# Patient Record
Sex: Male | Born: 1975 | Race: White | Hispanic: No | Smoking: Current every day smoker
Health system: Southern US, Community
[De-identification: ages and names within clinical notes are randomized; demographics above are authoritative.]

## PROBLEM LIST (undated history)

## (undated) DIAGNOSIS — T7840XA Allergy, unspecified, initial encounter: Secondary | ICD-10-CM

## (undated) DIAGNOSIS — I10 Essential (primary) hypertension: Secondary | ICD-10-CM

## (undated) HISTORY — PX: ANKLE SURGERY: SHX546

## (undated) HISTORY — DX: Allergy, unspecified, initial encounter: T78.40XA

## (undated) HISTORY — PX: HERNIA REPAIR: SHX51

## (undated) HISTORY — PX: HAND SURGERY: SHX662

## (undated) HISTORY — DX: Essential (primary) hypertension: I10

---

## 2005-02-17 ENCOUNTER — Emergency Department (HOSPITAL_COMMUNITY): Admission: EM | Admit: 2005-02-17 | Discharge: 2005-02-17 | Payer: Self-pay | Admitting: Emergency Medicine

## 2009-01-24 ENCOUNTER — Observation Stay (HOSPITAL_COMMUNITY): Admission: AD | Admit: 2009-01-24 | Discharge: 2009-01-24 | Payer: Self-pay | Admitting: Cardiology

## 2011-04-06 LAB — POCT I-STAT, CHEM 8
BUN: 11 mg/dL (ref 6–23)
Calcium, Ion: 1.1 mmol/L — ABNORMAL LOW (ref 1.12–1.32)
Creatinine, Ser: 1 mg/dL (ref 0.4–1.5)
Hemoglobin: 18 g/dL — ABNORMAL HIGH (ref 13.0–17.0)
Sodium: 140 mEq/L (ref 135–145)
TCO2: 25 mmol/L (ref 0–100)

## 2011-04-06 LAB — PROTIME-INR: INR: 1 (ref 0.00–1.49)

## 2011-04-06 LAB — DIFFERENTIAL
Basophils Relative: 1 % (ref 0–1)
Eosinophils Absolute: 0.1 10*3/uL (ref 0.0–0.7)
Eosinophils Relative: 1 % (ref 0–5)
Lymphs Abs: 1.9 10*3/uL (ref 0.7–4.0)
Monocytes Relative: 9 % (ref 3–12)
Neutrophils Relative %: 68 % (ref 43–77)

## 2011-04-06 LAB — POCT CARDIAC MARKERS
CKMB, poc: 1.1 ng/mL (ref 1.0–8.0)
CKMB, poc: <1 ng/mL — ABNORMAL LOW (ref 1.0–8.0)
Myoglobin, poc: 49.1 ng/mL (ref 12–200)
Myoglobin, poc: 70.5 ng/mL (ref 12–200)
Troponin i, poc: <0.05 ng/mL (ref 0.00–0.09)

## 2011-04-06 LAB — CBC
HCT: 50.1 % (ref 39.0–52.0)
MCHC: 34.1 g/dL (ref 30.0–36.0)
MCV: 95.8 fL (ref 78.0–100.0)
Platelets: 208 10*3/uL (ref 150–400)
RBC: 5.23 MIL/uL (ref 4.22–5.81)
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
## 2013-04-14 ENCOUNTER — Encounter (HOSPITAL_COMMUNITY): Payer: Self-pay

## 2013-04-14 ENCOUNTER — Emergency Department (HOSPITAL_COMMUNITY)
Admission: EM | Admit: 2013-04-14 | Discharge: 2013-04-14 | Payer: Self-pay | Attending: Emergency Medicine | Admitting: Emergency Medicine

## 2013-04-14 DIAGNOSIS — T24229A Burn of second degree of unspecified knee, initial encounter: Secondary | ICD-10-CM | POA: Insufficient documentation

## 2013-04-14 DIAGNOSIS — X038XXA Other exposure to controlled fire, not in building or structure, initial encounter: Secondary | ICD-10-CM | POA: Insufficient documentation

## 2013-04-14 DIAGNOSIS — Y9289 Other specified places as the place of occurrence of the external cause: Secondary | ICD-10-CM | POA: Insufficient documentation

## 2013-04-14 DIAGNOSIS — T23209A Burn of second degree of unspecified hand, unspecified site, initial encounter: Secondary | ICD-10-CM | POA: Insufficient documentation

## 2013-04-14 DIAGNOSIS — Y9389 Activity, other specified: Secondary | ICD-10-CM | POA: Insufficient documentation

## 2013-04-14 DIAGNOSIS — F17200 Nicotine dependence, unspecified, uncomplicated: Secondary | ICD-10-CM | POA: Insufficient documentation

## 2013-04-14 LAB — CBC WITH DIFFERENTIAL/PLATELET
Basophils Absolute: 0.1 10*3/uL (ref 0.0–0.1)
Basophils Relative: 0 % (ref 0–1)
Eosinophils Relative: 1 % (ref 0–5)
HCT: 49.4 % (ref 39.0–52.0)
Lymphocytes Relative: 22 % (ref 12–46)
MCHC: 36.4 g/dL — ABNORMAL HIGH (ref 30.0–36.0)
MCV: 88.2 fL (ref 78.0–100.0)
Monocytes Absolute: 1 10*3/uL (ref 0.1–1.0)
Neutro Abs: 8.3 10*3/uL — ABNORMAL HIGH (ref 1.7–7.7)
Platelets: 287 10*3/uL (ref 150–400)
RDW: 12.2 % (ref 11.5–15.5)
WBC: 12.1 10*3/uL — ABNORMAL HIGH (ref 4.0–10.5)

## 2013-04-14 LAB — BASIC METABOLIC PANEL
Calcium: 9.5 mg/dL (ref 8.4–10.5)
Creatinine, Ser: 0.98 mg/dL (ref 0.50–1.35)
GFR calc Af Amer: >90 mL/min (ref >90)
GFR calc non Af Amer: >90 mL/min (ref >90)
Sodium: 137 mEq/L (ref 135–145)

## 2013-04-14 MED ORDER — SODIUM CHLORIDE 0.9 % IV SOLN
INTRAVENOUS | Status: DC
  Administered 2013-04-14: 23:00:00 via INTRAVENOUS

## 2013-04-14 MED ORDER — FENTANYL CITRATE 0.05 MG/ML IJ SOLN: 50.0000 ug | Freq: Once | INTRAMUSCULAR | Status: AC

## 2013-04-14 MED ORDER — HYDROMORPHONE HCL PF 1 MG/ML IJ SOLN
1.0000 mg | INTRAMUSCULAR | Status: DC | PRN
  Administered 2013-04-14 (×2): 1 mg via INTRAVENOUS
  Filled 2013-04-14 (×2): qty 1

## 2013-04-14 MED ORDER — ONDANSETRON HCL 4 MG/2ML IJ SOLN
4.0000 mg | Freq: Once | INTRAMUSCULAR | Status: AC | PRN
  Administered 2013-04-14: 4 mg via INTRAVENOUS
  Filled 2013-04-14: qty 2

## 2013-04-14 MED ORDER — FENTANYL CITRATE 0.05 MG/ML IJ SOLN
INTRAMUSCULAR | Status: AC
  Administered 2013-04-14: 50 ug via INTRAVENOUS
  Filled 2013-04-14: qty 2
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
## 2013-04-30 DIAGNOSIS — IMO0002 Reserved for concepts with insufficient information to code with codable children: Secondary | ICD-10-CM | POA: Insufficient documentation

## 2014-12-02 ENCOUNTER — Other Ambulatory Visit: Payer: Self-pay

## 2014-12-02 ENCOUNTER — Encounter (HOSPITAL_COMMUNITY): Payer: Self-pay | Admitting: Emergency Medicine

## 2014-12-02 ENCOUNTER — Emergency Department (INDEPENDENT_AMBULATORY_CARE_PROVIDER_SITE_OTHER)
Admission: EM | Admit: 2014-12-02 | Discharge: 2014-12-02 | Disposition: A | Discharge status: Home / Self Care | Payer: BC Managed Care – PPO | Source: Home / Self Care | Attending: Emergency Medicine | Admitting: Emergency Medicine

## 2014-12-02 ENCOUNTER — Ambulatory Visit (HOSPITAL_COMMUNITY): Payer: BC Managed Care – PPO | Attending: Emergency Medicine

## 2014-12-02 DIAGNOSIS — Z720 Tobacco use: Secondary | ICD-10-CM

## 2014-12-02 DIAGNOSIS — Z87891 Personal history of nicotine dependence: Secondary | ICD-10-CM | POA: Diagnosis not present

## 2014-12-02 DIAGNOSIS — R079 Chest pain, unspecified: Secondary | ICD-10-CM | POA: Diagnosis not present

## 2014-12-02 DIAGNOSIS — R0789 Other chest pain: Secondary | ICD-10-CM

## 2014-12-02 NOTE — ED Notes (Signed)
Pt states that he has had chest pain for 6 months intermittent chest pain.

## 2014-12-02 NOTE — ED Provider Notes (Signed)
CSN: [id number]     Arrival date & time 12/02/14  1058 History   First MD Initiated Contact with Patient 12/02/14 1145     Chief Complaint  Patient presents with  . 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 It is intermittent. It usually last for only a few seconds at a time. It began over 6 months ago but is getting worse and is now becoming associated with occasional lightheadedness. He states the pain is worse with smoking and sometimes eating; not exertional.  He denies shortness of breath, nausea or vomiting. He has a  family history of early heart disease and diabetes mellitus. Patient states that he is generally sedentary and smokes.   History reviewed. No pertinent past medical history. Past Surgical History  Procedure Laterality Date  . Hand surgery     History reviewed. No pertinent family history. History  Substance Use Topics  . Smoking status: Current Every Day Smoker -- 0.50 packs/day    Types: Cigarettes  . Smokeless tobacco: Not on file  . Alcohol Use: Yes     Comment: regularly    Review of Systems  Constitutional: Negative for fever, activity change and fatigue.  HENT: Negative.   Respiratory: Negative for cough, shortness of breath and wheezing.   Cardiovascular: Positive for chest pain. Negative for palpitations and leg swelling.  Musculoskeletal: Negative.        Movement does not elicit pain however on occasion he will raise his arm and place it over the top of his head and this often relieves the pain.  Skin: Negative.   Neurological: Positive for light-headedness.    Allergies  Penicillins  Home Medications   Prior to Admission medications   Medication Sig Start Date End Date Taking? Authorizing Provider  ibuprofen  (ADVIL,MOTRIN) 200 MG tablet Take 600 mg by mouth every 6 (six) hours as needed for pain.    Historical Provider, MD  oxyCODONE (OXY IR/ROXICODONE) 5 MG immediate release tablet Take 5 mg by mouth once.     Historical Provider, MD   BP 142/92 mmHg  Pulse 109  Temp(Src) 98.1 F (36.7 C) (Oral)  Resp 16  SpO2 100% Physical Exam  Constitutional: He is oriented to person, place, and time. He appears well-developed and well-nourished. No distress.  HENT:  Head: Normocephalic and atraumatic.  Eyes: Conjunctivae and EOM are normal.  Neck: Normal range of motion. Neck supple.  Cardiovascular: Normal rate, regular rhythm, normal heart sounds and intact distal pulses.   Pulmonary/Chest: Effort normal and breath sounds normal. No respiratory distress. He has no wheezes. He has no rales. He exhibits no tenderness.  Unable to locate tenderness within the chest wall, unable to reproduce pain with arm and chest movement.  Abdominal: Soft. He exhibits no distension. There is no tenderness.  Musculoskeletal: Normal range of motion. He exhibits no edema or tenderness.  Lymphadenopathy:    He has no cervical adenopathy.  Neurological: He is alert and oriented to person, place, and time. No cranial nerve deficit.  Skin: Skin is warm and dry. No rash noted.  Psychiatric: He has a normal mood and affect.  Nursing note and vitals reviewed.   ED Course  Procedures (including critical care time) Labs Review Labs Reviewed - No data to display EKG: NSR. No ectopy. No ST-T changes. Normal axis. Imaging  Review Dg Chest 2 View  12/02/2014   CLINICAL DATA:  LEFT upper chest pain and discomfort for 6 months worse in past 2 weeks, history smoking/tobacco abuse  EXAM: CHEST  2 VIEW  COMPARISON:  01/24/2009  FINDINGS: Upper normal heart size.  Mediastinal contours and pulmonary vascularity normal.  Minimal chronic peribronchial thickening.  No pulmonary infiltrate, pleural effusion or pneumothorax.  Bones  unremarkable.  IMPRESSION: No acute abnormalities.   Electronically Signed   By: Ulyses SouthwardMark  Boles M.D.   On: 12/02/2014 13:30     MDM   1. Atypical chest pain   2. Chest pain   3. Tobacco abuse disorder     Chest Pain (Nonspecific) This exam can not conclude that you do not have a heart problem although there is no objective evidence that you are at this time.  To obtain a more through testing for your heart you would need to go to the ED. It is recommended that you obtain a primary care provider as soon as possible for additional evaluation.  If you develop problems as listed below and discussed see medical attention promptly.     Hayden Rasmussenavid [place], NP 12/02/14 1349

## 2014-12-02 NOTE — Discharge Instructions (Signed)
Chest Pain (Nonspecific) This exam can not conclude that you do not have a heart problem although there is no objective evidence that you are at this time.  To obtain a more through testing for your heart you would need to go to the ED. It is recommended that you obtain a primary care provider as soon as possible for additional evaluation.  If you develop problems as listed below and discussed see medical attention promptly.  It is often hard to give a specific diagnosis for the cause of chest pain. There is always a chance that your pain could be related to something serious, such as a heart attack or a blood clot in the lungs. You need to follow up with your health care provider for further evaluation. CAUSES   Heartburn.  Pneumonia or bronchitis.  Anxiety or stress.  Inflammation around your heart (pericarditis) or lung (pleuritis or pleurisy).  A blood clot in the lung.  A collapsed lung (pneumothorax). It can develop suddenly on its own (spontaneous pneumothorax) or from trauma to the chest.  Shingles infection (herpes zoster virus). The chest wall is composed of bones, muscles, and cartilage. Any of these can be the source of the pain.  The bones can be bruised by injury.  The muscles or cartilage can be strained by coughing or overwork.  The cartilage can be affected by inflammation and become sore (costochondritis). DIAGNOSIS  Lab tests or other studies may be needed to find the cause of your pain. Your health care provider may have you take a test called an ambulatory electrocardiogram (ECG). An ECG records your heartbeat patterns over a 24-hour period. You may also have other tests, such as:  Transthoracic echocardiogram (TTE). During echocardiography, sound waves are used to evaluate how blood flows through your heart.  Transesophageal echocardiogram (TEE).  Cardiac monitoring. This allows your health care provider to monitor your heart rate and rhythm in real  time.  Holter monitor. This is a portable device that records your heartbeat and can help diagnose heart arrhythmias. It allows your health care provider to track your heart activity for several days, if needed.  Stress tests by exercise or by giving medicine that makes the heart beat faster. TREATMENT   Treatment depends on what may be causing your chest pain. Treatment may include:  Acid blockers for heartburn.  Anti-inflammatory medicine.  Pain medicine for inflammatory conditions.  Antibiotics if an infection is present.  You may be advised to change lifestyle habits. This includes stopping smoking and avoiding alcohol, caffeine, and chocolate.  You may be advised to keep your head raised (elevated) when sleeping. This reduces the chance of acid going backward from your stomach into your esophagus. Most of the time, nonspecific chest pain will improve within 2-3 days with rest and mild pain medicine.  HOME CARE INSTRUCTIONS   If antibiotics were prescribed, take them as directed. Finish them even if you start to feel better.  For the next few days, avoid physical activities that bring on chest pain. Continue physical activities as directed.  Do not use any tobacco products, including cigarettes, chewing tobacco, or electronic cigarettes.  Avoid drinking alcohol.  Only take medicine as directed by your health care provider.  Follow your health care provider's suggestions for further testing if your chest pain does not go away.  Keep any follow-up appointments you made. If you do not go to an appointment, you could develop lasting (chronic) problems with pain. If there is any problem keeping  an appointment, call to reschedule. SEEK MEDICAL CARE IF:   Your chest pain does not go away, even after treatment.  You have a rash with blisters on your chest.  You have a fever. SEEK IMMEDIATE MEDICAL CARE IF:   You have increased chest pain or pain that spreads to your arm,  neck, jaw, back, or abdomen.  You have shortness of breath.  You have an increasing cough, or you cough up blood.  You have severe back or abdominal pain.  You feel nauseous or vomit.  You have severe weakness.  You faint.  You have chills. This is an emergency. Do not wait to see if the pain will go away. Get medical help at once. Call your local emergency services (911 in [place] Do not drive yourself to the hospital. MAKE SURE YOU:   Understand these instructions.  Will watch your condition.  Will get help right away if you are not doing well or get worse. Document Released: 09/15/2005 Document Revised: 12/11/2013 Document Reviewed: 07/11/2008 [facility] Patient Information 2015 [place], [place]. This information is not intended to replace advice given to you by your health care provider. Make sure you discuss any questions you have with your health care provider.  Myocardial Infarction A myocardial infarction (MI) is also called a heart attack. It causes damage to the heart that cannot be fixed. An MI often happens when a blood clot or other blockage cuts blood flow to the heart. When this happens, certain areas of the heart begin to die. This is an emergency. HOME CARE  Take medicine as told by your doctor.  Change certain behaviors as told by your doctor. This may include:  Quitting smoking.  Being active.  Keeping a healthy weight.  Eating a heart-healthy diet. Ask your doctor for help with this diet.  Keeping your diabetes under control.  Lessening stress.  Limiting how much alcohol you drink. GET HELP RIGHT AWAY IF:  You have crushing or pressure-like chest pain that spreads to the arms, back, neck, or jaw. Call your local emergency services (911 in [place] Do not drive yourself to the hospital.  You have severe chest pain.  You have shortness of breath during rest, sleep, or with activity.  You have sudden sweating or clammy skin.  You feel sick to your  stomach (nauseous) and throw up (vomit).  You suddenly get lightheaded or dizzy.  You feel your heart beating fast or skipping beats. MAKE SURE YOU:   Understand these instructions.  Will watch your condition.  Will get help right away if you are not doing well or get worse. Document Released: 06/06/2012 Document Reviewed: 02/08/2014 [facility] Patient Information 2015 [place], [place]. This information is not intended to replace advice given to you by your health care provider. Make sure you discuss any questions you have with your health care provider.  Smoking Hazards Smoking cigarettes is extremely bad for your health. Tobacco smoke has over 200 known poisons in it. It contains the poisonous gases nitrogen oxide and carbon monoxide. There are over 60 chemicals in tobacco smoke that cause cancer. Some of the chemicals found in cigarette smoke include:   Cyanide.   Benzene.   Formaldehyde.   Methanol (wood alcohol).   Acetylene (fuel used in welding torches).   Ammonia.  Even smoking lightly shortens your life expectancy by several years. You can greatly reduce the risk of medical problems for you and your family by stopping now. Smoking is the most preventable cause of  death and disease in our society. Within days of quitting smoking, your circulation improves, you decrease the risk of having a heart attack, and your lung capacity improves. There may be some increased phlegm in the first few days after quitting, and it may take months for your lungs to clear up completely. Quitting for 10 years reduces your risk of developing lung cancer to almost that of a nonsmoker.  WHAT ARE THE RISKS OF SMOKING? Cigarette smokers have an increased risk of many serious medical problems, including:  Lung cancer.   Lung disease (such as pneumonia, bronchitis, and emphysema).   Heart attack and chest pain due to the heart not getting enough oxygen (angina).   Heart disease and  peripheral blood vessel disease.   Hypertension.   Stroke.   Oral cancer (cancer of the lip, mouth, or voice box).   Bladder cancer.   Pancreatic cancer.   Cervical cancer.   Pregnancy complications, including premature birth.   Stillbirths and smaller newborn babies, birth defects, and genetic damage to sperm.   Early menopause.   Lower estrogen level for women.   Infertility.   Facial wrinkles.   Blindness.   Increased risk of broken bones (fractures).   Senile dementia.   Stomach ulcers and internal bleeding.   Delayed wound healing and increased risk of complications during surgery. Because of secondhand smoke exposure, children of smokers have an increased risk of the following:   Sudden infant death syndrome (SIDS).   Respiratory infections.   Lung cancer.   Heart disease.   Ear infections.  WHY IS SMOKING ADDICTIVE? Nicotine is the chemical agent in tobacco that is capable of causing addiction or dependence. When you smoke and inhale, nicotine is absorbed rapidly into the bloodstream through your lungs. Both inhaled and noninhaled nicotine may be addictive.  WHAT ARE THE BENEFITS OF QUITTING?  There are many health benefits to quitting smoking. Some are:   The likelihood of developing cancer and heart disease decreases. Health improvements are seen almost immediately.   Blood pressure, pulse rate, and breathing patterns start returning to normal soon after quitting.   People who quit may see an improvement in their overall quality of life.  HOW DO YOU QUIT SMOKING? Smoking is an addiction with both physical and psychological effects, and longtime habits can be hard to change. Your health care provider can recommend:  Programs and community resources, which may include group support, education, or therapy.  Replacement products, such as patches, gum, and nasal sprays. Use these products only as directed. Do not replace  cigarette smoking with electronic cigarettes (commonly called e-cigarettes). The safety of e-cigarettes is unknown, and some may contain harmful chemicals. FOR MORE INFORMATION  American Lung Association: www.lung.org  American Cancer Society: www.cancer.org Document Released: 01/13/2005 Document Revised: 09/26/2013 Document Reviewed: 05/28/2013 [facility] Patient Information 2015 [place], [place]. This information is not intended to replace advice given to you by your health care provider. Make sure you discuss any questions you have with your health care provider.

## 2014-12-03 ENCOUNTER — Ambulatory Visit (INDEPENDENT_AMBULATORY_CARE_PROVIDER_SITE_OTHER): Payer: BC Managed Care – PPO | Admitting: Family Medicine

## 2014-12-03 VITALS — BP 136/88 | HR 93 | Temp 97.7°F | Resp 20 | Ht 69.25 in | Wt 234.0 lb

## 2014-12-03 DIAGNOSIS — E669 Obesity, unspecified: Secondary | ICD-10-CM

## 2014-12-03 DIAGNOSIS — Z720 Tobacco use: Secondary | ICD-10-CM

## 2014-12-03 DIAGNOSIS — R0789 Other chest pain: Secondary | ICD-10-CM

## 2014-12-03 DIAGNOSIS — F17200 Nicotine dependence, unspecified, uncomplicated: Secondary | ICD-10-CM

## 2014-12-03 LAB — POCT CBC
GRANULOCYTE PERCENT: 72.5 % (ref 37–80)
HCT, POC: 51.7 % (ref 43.5–53.7)
Hemoglobin: 17.2 g/dL (ref 14.1–18.1)
Lymph, poc: 1.9 (ref 0.6–3.4)
MCH, POC: 31.5 pg — AB (ref 27–31.2)
MCHC: 33.3 g/dL (ref 31.8–35.4)
MCV: 94.8 fL (ref 80–97)
MID (CBC): 0.6 (ref 0–0.9)
MPV: 7.8 fL (ref 0–99.8)
PLATELET COUNT, POC: 232 10*3/uL (ref 142–424)
POC GRANULOCYTE: 6.5 (ref 2–6.9)
POC LYMPH %: 21 % (ref 10–50)
POC MID %: 6.5 % (ref 0–12)
RBC: 5.46 M/uL (ref 4.69–6.13)
RDW, POC: 13.4 %
WBC: 9 10*3/uL (ref 4.6–10.2)

## 2014-12-03 LAB — LIPID PANEL
CHOL/HDL RATIO: 4.6 ratio
Cholesterol: 222 mg/dL — ABNORMAL HIGH (ref 0–200)
HDL: 48 mg/dL (ref >39)
LDL CALC: 125 mg/dL — AB (ref 0–99)
TRIGLYCERIDES: 245 mg/dL — AB (ref <150)
VLDL: 49 mg/dL — AB (ref 0–40)

## 2014-12-03 LAB — COMPREHENSIVE METABOLIC PANEL
ALK PHOS: 74 U/L (ref 39–117)
ALT: 25 U/L (ref 0–53)
AST: 21 U/L (ref 0–37)
Albumin: 4.6 g/dL (ref 3.5–5.2)
BILIRUBIN TOTAL: 0.4 mg/dL (ref 0.2–1.2)
BUN: 16 mg/dL (ref 6–23)
CO2: 29 mEq/L (ref 19–32)
Calcium: 9.3 mg/dL (ref 8.4–10.5)
Chloride: 103 mEq/L (ref 96–112)
Creat: 0.97 mg/dL (ref 0.50–1.35)
Glucose, Bld: 121 mg/dL — ABNORMAL HIGH (ref 70–99)
Potassium: 4.3 mEq/L (ref 3.5–5.3)
SODIUM: 141 meq/L (ref 135–145)
TOTAL PROTEIN: 7.3 g/dL (ref 6.0–8.3)

## 2014-12-03 LAB — POCT GLYCOSYLATED HEMOGLOBIN (HGB A1C): HEMOGLOBIN A1C: 5.2

## 2014-12-03 NOTE — Patient Instructions (Addendum)
Try to increase regular exercise. You do not need to be going to a gym to get exercise, although that is good. I urge you to try to get at least 30 minutes of walking or other moving type exercise at least 5 days a week.  Work hard on weight loss.   Cut out liquid calories. Water is the best drink  Decrease portions at mealtimes, especially of., Pasta, potatoes, rice. Learn to fill up on fruits and vegetables.  Cut out most snacks. If you're going to snack or nipple on something use fruit or grapes or vegetables  Try to be consistent in your eating choices  Cut out obvious junk foods  Stop smoking. This is a choice you need to make. You have cut back, but you are still using crutches. You need to pick a quit date and get rid of everything completely. Tobacco is a very leading cause of cardiovascular disease.  We will let you know the results of the remainder of your labs.  Referral is being made to a cardiologist for you.  Return at anytime if needed.

## 2014-12-03 NOTE — Progress Notes (Signed)
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 [facility] he has been having problems over the last 6 months intermittently with having left chest pain. These are usually brief and fleeting. Some very mild, sometimes sharp. Do not seem to be activity related. Bothers him less at night. He sometimes is a little tender on his left chest wall below the breast. He does smoke though he is working at cutting back on that. There is a significant family history of heart disease in his mother and his father. His father is also diabetic. The patient is very sedentary. [place] Long hours are spent in the truck. There is the stress of the heavy traffic. He does not want to be the cause of anything bad happening. No other major life issues are stresses. He is single.  Objective: No acute distress. Obese. Throat clear. Neck supple without nodes or thyromegaly. No carotid bruits. Chest is clear to auscultation. Heart regular without murmurs gallops or arrhythmias. Abdomen soft without organomegaly mass or tenderness. Mild left lower anterior chest wall tenderness along the rib cage.  EKG was done yesterday and the reading was normal although I could not get the image of the EKG.  Yesterday's chest x-ray was normal.  Assessment: Atypical chest pain Obesity Tobacco use disorder  Plan: He must stop smoking He must work hard on weight loss and regular exercise  Long discussions about both of these.  Baseline labs  Cardiology referral just to be on the safe side because he is a Naval architecttruck driver.  Results for orders placed or performed in visit on 12/03/14  POCT CBC  Result Value Ref Range   WBC 9.0 4.6 - 10.2 K/uL   Lymph, poc 1.9 0.6 - 3.4   POC LYMPH PERCENT 21.0 10 - 50 %L   MID (cbc) 0.6 0 - 0.9   POC MID % 6.5 0 - 12 %M   POC Granulocyte 6.5 2 - 6.9   Granulocyte percent 72.5 37 - 80 %G   RBC 5.46  4.69 - 6.13 M/uL   Hemoglobin 17.2 14.1 - 18.1 g/dL   HCT, POC 40.951.7 81.143.5 - 53.7 %   MCV 94.8 80 - 97 fL   MCH, POC 31.5 (A) 27 - 31.2 pg   MCHC 33.3 31.8 - 35.4 g/dL   RDW, POC 91.413.4 %   Platelet Count, POC 232 142 - 424 K/uL   MPV 7.8 0 - 99.8 fL  POCT glycosylated hemoglobin (Hb A1C)  Result Value Ref Range   Hemoglobin A1C 5.2

## 2014-12-25 ENCOUNTER — Encounter: Payer: Self-pay | Admitting: Cardiovascular Disease

## 2014-12-25 ENCOUNTER — Ambulatory Visit (INDEPENDENT_AMBULATORY_CARE_PROVIDER_SITE_OTHER): Payer: No Typology Code available for payment source | Admitting: Cardiovascular Disease

## 2014-12-25 VITALS — BP 130/74 | HR 80 | Ht 69.0 in | Wt 235.5 lb

## 2014-12-25 DIAGNOSIS — R079 Chest pain, unspecified: Secondary | ICD-10-CM

## 2014-12-25 DIAGNOSIS — R0789 Other chest pain: Secondary | ICD-10-CM | POA: Insufficient documentation

## 2014-12-25 NOTE — Assessment & Plan Note (Signed)
Brandon Mccoy presents with episodes of atypical chest pain. These episodes of pain sound more like gastroesophageal reflux and do not sound like ischemic heart disease. They seem to be affected more by what he eats and how much he eats than by exercise .  He admits that he eats a very poor diet. He no longer gets any exercise and continues to smoke.  I strongly advised him to work on his smoking. He's already started to improve his diet and will make a big effort to further improve his diet. He'll try to gets regular exercise. If his chest pains gradually improve as he fixes his diet and that is reassuring that this is not ischemic heart disease. On the other hand, if he continues to have any episodes of chest discomfort after making significant improvements, we'll need to proceed with further testing. I'll see him back in the office in 2-3 months for follow-up visit for follow-up. He is to call sooner if he has any additional problems.

## 2014-12-25 NOTE — Progress Notes (Signed)
     Brandon ShowsJames Mccoy Date of Birth  05/03/1976       [facility]    [place] [facility] [address]  [address] [place], [place]  [id number]   [place], [place]  [id number] [phone]     [phone]   Fax  [phone]     Fax [phone]  Problem List: 1. Chest pain  History of Present Illness:  Brandon Mccoy is a 39 yo who is [facility] by  [facility]    For evaluation of chest pain .   Chest pains for the past 6 months, last for a few seconds, then recurs within several minutes or several hours.  Intermittant. Various foods may exacerbate these CP.    Works as a Naval architecttruck driver  [place] and his diet is not great  Tries to eat more healthy foods.  Thinks his CP are better if he eats better  Is not able to exercise much ,  Drives 8-10 hours a day.    Smokes - 1/2 ppd, trying to stop.  Occasional beer.   No current outpatient prescriptions on file prior to visit.   No current facility-administered medications on file prior to visit.    Allergies  Allergen Reactions  . Penicillins     unknown    History reviewed. No pertinent past medical history.  Past Surgical History  Procedure Laterality Date  . Hand surgery    . Ankle surgery    . Hernia repair      History  Smoking status  . Current Every Day Smoker -- 0.50 packs/day  . Types: Cigarettes  Smokeless tobacco  . Not on file    History  Alcohol Use  . Yes    Comment: regularly    Family History  Problem Relation Age of Onset  . Heart disease Father   . CVA Father     Reviw of Systems:  Reviewed in the HPI.  All other systems are negative.  Physical Exam: Blood pressure 130/74, pulse 80, height 5\' 9"  (1.753 m), weight 235 lb 8 oz (106.822 kg). Wt Readings from Last 3 Encounters:  12/25/14 235 lb 8 oz (106.822 kg)  12/03/14 234 lb (106.142 kg)     General: Well developed, well nourished, in no acute  distress.  Head: Normocephalic, atraumatic, sclera non-icteric, mucus membranes are moist,   Neck: Supple. Carotids are 2 + without bruits. No JVD   Lungs: Clear   Heart: RR, normal s1s2,   Abdomen: Soft, non-tender, non-distended with normal bowel sounds.  Msk:  Strength and tone are normal   Extremities: No clubbing or cyanosis. No edema.  Distal pedal pulses are 2+ and equal    Neuro: CN II - XII intact.  Alert and oriented X 3.   Psych:  Normal   ECG: Dec 25, 2014:  NSR at 80, normal ECG   Assessment / Plan:

## 2014-12-25 NOTE — Patient Instructions (Signed)
Your physician recommends that you schedule a follow-up appointment in:  3 months with Dr. Elease [facility]imotoNahser [facility]  Your physician recommends that you continue on your current medications as directed. Please refer to the Current Medication list given to you today.

## 2015-03-14 ENCOUNTER — Ambulatory Visit: Payer: No Typology Code available for payment source | Admitting: Cardiovascular Disease

## 2016-04-27 ENCOUNTER — Ambulatory Visit (INDEPENDENT_AMBULATORY_CARE_PROVIDER_SITE_OTHER): Payer: Self-pay | Admitting: Family Medicine

## 2016-04-27 ENCOUNTER — Encounter: Payer: Self-pay | Admitting: Family Medicine

## 2016-04-27 VITALS — BP 165/109 | HR 86 | Temp 98.7°F | Resp 16 | Ht 69.0 in | Wt 249.4 lb

## 2016-04-27 DIAGNOSIS — R519 Headache, unspecified: Secondary | ICD-10-CM | POA: Insufficient documentation

## 2016-04-27 DIAGNOSIS — R0789 Other chest pain: Secondary | ICD-10-CM

## 2016-04-27 DIAGNOSIS — J069 Acute upper respiratory infection, unspecified: Secondary | ICD-10-CM

## 2016-04-27 DIAGNOSIS — R51 Headache: Secondary | ICD-10-CM

## 2016-04-27 DIAGNOSIS — I10 Essential (primary) hypertension: Secondary | ICD-10-CM

## 2016-04-27 DIAGNOSIS — Z7189 Other specified counseling: Secondary | ICD-10-CM

## 2016-04-27 DIAGNOSIS — Z7689 Persons encountering health services in other specified circumstances: Secondary | ICD-10-CM

## 2016-04-27 DIAGNOSIS — F17200 Nicotine dependence, unspecified, uncomplicated: Secondary | ICD-10-CM

## 2016-04-27 DIAGNOSIS — Z720 Tobacco use: Secondary | ICD-10-CM

## 2016-04-27 MED ORDER — FLUTICASONE PROPIONATE 50 MCG/ACT NA SUSP: 2.0000 | Freq: Every day | NASAL | Status: DC

## 2016-04-27 MED ORDER — OXYMETAZOLINE HCL 0.05 % NA SOLN
NASAL | Status: DC
Start: 2016-04-27 — End: 2016-06-10

## 2016-04-27 MED ORDER — AMLODIPINE BESYLATE 5 MG PO TABS
5.0000 mg | ORAL_TABLET | Freq: Every day | ORAL | Status: DC
Start: 2016-04-27 — End: 2016-09-10

## 2016-04-27 MED ORDER — DM-GUAIFENESIN ER 30-600 MG PO TB12
1.0000 | ORAL_TABLET | Freq: Two times a day (BID) | ORAL | Status: DC
Start: 2016-04-27 — End: 2016-06-10

## 2016-04-27 NOTE — Patient Instructions (Addendum)
Your symptoms are consistent with a viral upper respiratory infection. At this time there is no need for antibiotics.  If your symptoms persist for > 10 days or get better and than worsen please let me know. You may have a secondary bacterial infection.  You can use supportive care at home to help with your symptoms. I have sent Mucinex DM to your pharmacy to help break up the congestion and soothe your cough. You can takes this twice daily.  Honey is a natural cough suppressant- so add it to your tea in the morning.  If you have a humidifer, set that up in your bedroom at night.  You take afrin to help congestion. Cordicin HBP.   Add amlodipine for blood pressure. Your goal blood pressure is 140/90. Work on low salt/sodium diet - goal <1.5gm (1,500mg ) per day. Eat a diet high in fruits/vegetables and whole grains.  Look into mediterranean and DASH diet. Goal activity is 110350min/wk of moderate intensity exercise.  This can be split into 30 minute chunks.  If you are not at this level, you can start with smaller 10-15 min increments and slowly build up activity. Look at www.heart.org for more resources  Please seek immediate medical attention at ER or Urgent Care if you develop: Chest pain, pressure or tightness. Shortness of breath accompanied by nausea or diaphoresis Visual changes Numbness or tingling on one side of the body Facial droop Altered mental status Or any concerning symptoms.

## 2016-04-27 NOTE — Progress Notes (Signed)
Subjective:    Patient ID: Brandon Mccoy, male    DOB: 11/16/1976, 40 y.o.   MRN: [id number]  HPI: Brandon Mccoy is a 40 y.o. male presenting on 04/27/2016 for Establish Care   HPI  Pt presents to establish care today. Previous care provider was Dr. Tawanna Cooler in [place].  It has been  years since His last PCP visit. Records from previous provider will be requested and reviewed. Current medical problems include:  Hypertension: Diagnosed at [facility] in [place]. Was given lisinopril for blood pressure. No HA, chest pain, or visual changes. Does not check blood pressure at home.  HA: Starts at base of skull and radiates up head. Hurts when he turns his head. Pain is pulsing. Occurs 4-5 times per month. No sensitivity to light or sound with HA. No nausea or vomiting. Takes ibuprofen 800mg  to ease off. If he lays a certain way- feels like the pillow presses into neck. Can't put weight on neck.  Sinus: Sinus pain x 2 dasy. Frontal pain and congestion. No fever at home. No issues with seasonal allergies. Takes zyrtec at home. No help. Taking nyquil- helped for 5 minutes. DId not take any today.  Pt reporting L sided chest discomfort x 2 years off and on. Occurs at rest while driving. No SOB or diaphoresis.   Health maintenance:  TDAP: 2012 Never had colonscopy.  Smokes 1/2 PPD x20 years. Started age 25. Wants to quit. Drives a truck for living.  Not as active as he used to be.   Past Medical History  Diagnosis Date  . Allergy    Social History   Social History  . Marital Status: Married    Spouse Name: N/A  . Number of Children: N/A  . Years of Education: N/A   Occupational History  . Not on file.   Social History Main Topics  . Smoking status: Current Every Day Smoker -- 0.50 packs/day    Types: Cigarettes  . Smokeless tobacco: Not on file  . Alcohol Use: Yes     Comment: regularly  . Drug Use: No  . Sexual Activity: Not on file   Other Topics Concern  . Not on file   Social  History Narrative   Family History  Problem Relation Age of Onset  . Heart disease Father   . CVA Father   . Diabetes Father   . Diabetes Mother    No current outpatient prescriptions on file prior to visit.   No current facility-administered medications on file prior to visit.    Review of Systems  Constitutional: Positive for fatigue. Negative for fever and chills.  Respiratory: Negative for chest tightness, shortness of breath and wheezing.   Cardiovascular: Negative for chest pain, palpitations and leg swelling.  Gastrointestinal: Negative for nausea, vomiting and abdominal pain.  Endocrine: Negative.   Genitourinary: Negative for dysuria, urgency, discharge, penile pain and testicular pain.  Musculoskeletal: Negative for back pain, joint swelling and arthralgias.  Skin: Negative.   Neurological: Positive for headaches. Negative for dizziness, weakness and numbness.  Psychiatric/Behavioral: Negative for sleep disturbance and dysphoric mood.   Per HPI unless specifically indicated above     Objective:    BP 165/109 mmHg  Pulse 86  Temp(Src) 98.7 F (37.1 C)  Resp 16  Ht 5\' 9"  (1.753 m)  Wt 249 lb 6.4 oz (113.127 kg)  BMI 36.81 kg/m2  Wt Readings from Last 3 Encounters:  04/27/16 249 lb 6.4 oz (113.127 kg)  12/25/14 235 lb  8 oz (106.822 kg)  12/03/14 234 lb (106.142 kg)    Physical Exam  Constitutional: He is oriented to person, place, and time. He appears well-developed and well-nourished. No distress.  HENT:  Head: Normocephalic and atraumatic.  Right Ear: Hearing and tympanic membrane normal.  Left Ear: Hearing normal. A middle ear effusion is present.  Nose: Mucosal edema and rhinorrhea present. Right sinus exhibits no maxillary sinus tenderness and no frontal sinus tenderness. Left sinus exhibits no maxillary sinus tenderness and no frontal sinus tenderness.  Mouth/Throat: Mucous membranes are normal. No uvula swelling. Posterior oropharyngeal erythema  present.  Neck: Neck supple. No thyromegaly present.  Cardiovascular: Normal rate, regular rhythm and normal heart sounds.  Exam reveals no gallop and no friction rub.   No murmur heard. Pulmonary/Chest: Effort normal and breath sounds normal. He has no wheezes.  Abdominal: Soft. Bowel sounds are normal. He exhibits no distension. There is no tenderness. There is no rebound.  Musculoskeletal: Normal range of motion. He exhibits no edema or tenderness.  Neurological: He is alert and oriented to person, place, and time. He has normal reflexes.  Skin: Skin is warm and dry. No rash noted. No erythema.  Psychiatric: He has a normal mood and affect. His behavior is normal. Thought content normal.   Results for orders placed or performed in visit on 12/03/14  Comprehensive metabolic panel  Result Value Ref Range   Sodium 141 135 - 145 mEq/L   Potassium 4.3 3.5 - 5.3 mEq/L   Chloride 103 96 - 112 mEq/L   CO2 29 19 - 32 mEq/L   Glucose, Bld 121 (H) 70 - 99 mg/dL   BUN 16 6 - 23 mg/dL   Creat 0.270.97 2.530.50 - 6.641.35 mg/dL   Total Bilirubin 0.4 0.2 - 1.2 mg/dL   Alkaline Phosphatase 74 39 - 117 U/L   AST 21 0 - 37 U/L   ALT 25 0 - 53 U/L   Total Protein 7.3 6.0 - 8.3 g/dL   Albumin 4.6 3.5 - 5.2 g/dL   Calcium 9.3 8.4 - 40.310.5 mg/dL  Lipid panel  Result Value Ref Range   Cholesterol 222 (H) 0 - 200 mg/dL   Triglycerides 474245 (H) <150 mg/dL   HDL 48 >25>39 mg/dL   Total CHOL/HDL Ratio 4.6 Ratio   VLDL 49 (H) 0 - 40 mg/dL   LDL Cholesterol 956125 (H) 0 - 99 mg/dL  POCT CBC  Result Value Ref Range   WBC 9.0 4.6 - 10.2 K/uL   Lymph, poc 1.9 0.6 - 3.4   POC LYMPH PERCENT 21.0 10 - 50 %L   MID (cbc) 0.6 0 - 0.9   POC MID % 6.5 0 - 12 %M   POC Granulocyte 6.5 2 - 6.9   Granulocyte percent 72.5 37 - 80 %G   RBC 5.46 4.69 - 6.13 M/uL   Hemoglobin 17.2 14.1 - 18.1 g/dL   HCT, POC 38.751.7 56.443.5 - 53.7 %   MCV 94.8 80 - 97 fL   MCH, POC 31.5 (A) 27 - 31.2 pg   MCHC 33.3 31.8 - 35.4 g/dL   RDW, POC 33.213.4 %    Platelet Count, POC 232 142 - 424 K/uL   MPV 7.8 0 - 99.8 fL  POCT glycosylated hemoglobin (Hb A1C)  Result Value Ref Range   Hemoglobin A1C 5.2       Assessment & Plan:   Problem List Items Addressed This Visit      Cardiovascular and Mediastinum  HTN (hypertension)    Add amlodipine to BP regimen. Reviewed DASH diet. Encouraged smoking cessation. Encouarged weight los and exercise. Check CMET and lipid. Recheck 1 mos.       Relevant Medications   lisinopril (PRINIVIL,ZESTRIL) 10 MG tablet   amLODipine (NORVASC) 5 MG tablet   Other Relevant Orders   Comprehensive metabolic panel   Lipid Profile     Other   Chronic daily headache    Likely BP vs tension HA related. Symptoms have improved with controlling BP. Plan to get BP under control and address if continues. Check CMET. Recheck 1 mos.       Relevant Medications   amLODipine (NORVASC) 5 MG tablet   Smoker    Pt encouraged to quit smoking. 5 minutes of smoking cessation counseling given. Referred to [place] [facility].         Other Visit Diagnoses    Encounter to establish care    -  Primary    Upper respiratory infection        Viral URI. Supportive care at home. avoid OTC decongestants due to HTN. Abx indications reviewed. Return if not improving.     Relevant Medications    dextromethorphan-guaiFENesin (MUCINEX DM) 30-600 MG 12hr tablet    oxymetazoline (AFRIN NASAL SPRAY) 0.05 % nasal spray    fluticasone (FLONASE) 50 MCG/ACT nasal spray    Chest discomfort        ECG WNL. 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 If continues to ER or consider cardiology consult    Relevant Orders    EKG 12-Lead       Meds ordered this encounter  Medications  . lisinopril (PRINIVIL,ZESTRIL) 10 MG tablet    Sig: Reported on 04/27/2016    Refill:  0  . amLODipine (NORVASC) 5 MG tablet    Sig: Take 1 tablet (5 mg total) by mouth daily.    Dispense:  30 tablet    Refill:  11    Order Specific Question:  Supervising  Provider    Answer:  Janeann Forehand [phone]  . dextromethorphan-guaiFENesin (MUCINEX DM) 30-600 MG 12hr tablet    Sig: Take 1 tablet by mouth 2 (two) times daily.    Dispense:  20 tablet    Refill:  0    Order Specific Question:  Supervising Provider    Answer:  RAWLEIGH, RODE [phone]  . oxymetazoline (AFRIN NASAL SPRAY) 0.05 % nasal spray    Sig: 2 sprays each nostril twice daily for 3 days.    Dispense:  30 mL    Refill:  0    Order Specific Question:  Supervising Provider    Answer:  LANARD, ARGUIJO [[id number]]  . fluticasone (FLONASE) 50 MCG/ACT nasal spray    Sig: Place 2 sprays into both nostrils daily.    Dispense:  16 g    Refill:  11    Order Specific Question:  Supervising Provider    Answer:  SHYQUAN, STALLBAUMER [phone]      Follow up plan: Return in about 4 weeks (around 05/25/2016) for hypertension.Marland Kitchen

## 2016-04-27 NOTE — Assessment & Plan Note (Signed)
Pt encouraged to quit smoking. 5 minutes of smoking cessation counseling given. Referred to [facility].  

## 2016-04-27 NOTE — Assessment & Plan Note (Signed)
Likely BP vs tension HA related. Symptoms have improved with controlling BP. Plan to get BP under control and address if continues. Check CMET. Recheck 1 mos.

## 2016-04-27 NOTE — Assessment & Plan Note (Signed)
Add amlodipine to BP regimen. Reviewed DASH diet. Encouraged smoking cessation. Encouarged weight los and exercise. Check CMET and lipid. Recheck 1 mos.

## 2016-04-29 LAB — COMPREHENSIVE METABOLIC PANEL
ALK PHOS: 88 IU/L (ref 39–117)
ALT: 57 IU/L — ABNORMAL HIGH (ref 0–44)
AST: 37 IU/L (ref 0–40)
Albumin/Globulin Ratio: 1.6 (ref 1.2–2.2)
Albumin: 4.3 g/dL (ref 3.5–5.5)
BILIRUBIN TOTAL: 0.3 mg/dL (ref 0.0–1.2)
BUN/Creatinine Ratio: 14 (ref 9–20)
BUN: 14 mg/dL (ref 6–24)
CHLORIDE: 99 mmol/L (ref 96–106)
CO2: 24 mmol/L (ref 18–29)
CREATININE: 1.02 mg/dL (ref 0.76–1.27)
Calcium: 9 mg/dL (ref 8.7–10.2)
GFR calc Af Amer: 106 mL/min/{1.73_m2} (ref >59)
GFR calc non Af Amer: 92 mL/min/{1.73_m2} (ref >59)
GLOBULIN, TOTAL: 2.7 g/dL (ref 1.5–4.5)
Glucose: 107 mg/dL — ABNORMAL HIGH (ref 65–99)
POTASSIUM: 4.6 mmol/L (ref 3.5–5.2)
SODIUM: 141 mmol/L (ref 134–144)
Total Protein: 7 g/dL (ref 6.0–8.5)

## 2016-04-29 LAB — LIPID PANEL
CHOLESTEROL TOTAL: 193 mg/dL (ref 100–199)
Chol/HDL Ratio: 4.6 ratio units (ref 0.0–5.0)
HDL: 42 mg/dL (ref >39)
LDL CALC: 94 mg/dL (ref 0–99)
TRIGLYCERIDES: 283 mg/dL — AB (ref 0–149)
VLDL Cholesterol Cal: 57 mg/dL — ABNORMAL HIGH (ref 5–40)

## 2016-05-28 ENCOUNTER — Ambulatory Visit: Payer: Self-pay | Admitting: Family Medicine

## 2016-06-10 ENCOUNTER — Ambulatory Visit (INDEPENDENT_AMBULATORY_CARE_PROVIDER_SITE_OTHER): Payer: PRIVATE HEALTH INSURANCE | Admitting: Family Medicine

## 2016-06-10 ENCOUNTER — Other Ambulatory Visit: Payer: Self-pay | Admitting: Family Medicine

## 2016-06-10 VITALS — BP 149/87 | HR 82 | Temp 98.4°F | Resp 16 | Ht 69.0 in | Wt 248.0 lb

## 2016-06-10 DIAGNOSIS — I10 Essential (primary) hypertension: Secondary | ICD-10-CM

## 2016-06-10 DIAGNOSIS — R740 Nonspecific elevation of levels of transaminase and lactic acid dehydrogenase [LDH]: Secondary | ICD-10-CM | POA: Diagnosis not present

## 2016-06-10 DIAGNOSIS — M25562 Pain in left knee: Secondary | ICD-10-CM

## 2016-06-10 DIAGNOSIS — R7401 Elevation of levels of liver transaminase levels: Secondary | ICD-10-CM

## 2016-06-10 LAB — HEPATIC FUNCTION PANEL
ALBUMIN: 4.4 g/dL (ref 3.6–5.1)
ALK PHOS: 78 U/L (ref 40–115)
ALT: 60 U/L — ABNORMAL HIGH (ref 9–46)
AST: 38 U/L (ref 10–40)
BILIRUBIN DIRECT: 0.1 mg/dL (ref <=0.2)
BILIRUBIN INDIRECT: 0.2 mg/dL (ref 0.2–1.2)
BILIRUBIN TOTAL: 0.3 mg/dL (ref 0.2–1.2)
Total Protein: 7.2 g/dL (ref 6.1–8.1)

## 2016-06-10 MED ORDER — BLOOD PRESSURE CUFF MISC: 1.0000 | Freq: Every day | Status: DC

## 2016-06-10 MED ORDER — MELOXICAM 15 MG PO TABS
15.0000 mg | ORAL_TABLET | Freq: Every day | ORAL | Status: DC
Start: 2016-06-10 — End: 2016-10-19

## 2016-06-10 NOTE — Assessment & Plan Note (Signed)
Monitor closely. 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 Plan to increase amlodipine with continue elevated pressures. BP might be up due to pain today. Recheck 2 mos.

## 2016-06-10 NOTE — Progress Notes (Signed)
Subjective:    Patient ID: Brandon ShowsJames Flaum, male    DOB: Oct 27, 1976, 40 y.o.   MRN: [id number]  HPI: Brandon Mccoy is a 40 y.o. male presenting on 06/10/2016 for Hypertension   HPI  Hypertension- Not checking at home. No chest pain. No shortness of breath. No cough.  Knee pain- Started 3 days ago. Woke up with knee pain. Pain around knee cap. Has had issues with knee in the past. MRI 4 years ago- no issues. Pain occurs with bending and flexing. Knee feels unstable. Has been wearing a brace. Took and ibuprofen for pain- 400am and 400pm.   Past Medical History  Diagnosis Date  . Allergy     Current Outpatient Prescriptions on File Prior to Visit  Medication Sig  . amLODipine (NORVASC) 5 MG tablet Take 1 tablet (5 mg total) by mouth daily.  Marland Kitchen. lisinopril (PRINIVIL,ZESTRIL) 10 MG tablet Reported on 04/27/2016   No current facility-administered medications on file prior to visit.    Review of Systems  Constitutional: Negative for fever and chills.  HENT: Negative.   Respiratory: Negative for chest tightness, shortness of breath and wheezing.   Cardiovascular: Negative for chest pain, palpitations and leg swelling.  Gastrointestinal: Negative for nausea, vomiting and abdominal pain.  Endocrine: Negative.   Genitourinary: Negative for dysuria, urgency, discharge, penile pain and testicular pain.  Musculoskeletal: Negative for back pain, joint swelling and arthralgias.  Skin: Negative.   Neurological: Negative for dizziness, weakness, numbness and headaches.  Psychiatric/Behavioral: Negative for sleep disturbance and dysphoric mood.   Per HPI unless specifically indicated above     Objective:    BP 149/87 mmHg  Pulse 82  Temp(Src) 98.4 F (36.9 C) (Oral)  Resp 16  Ht 5\' 9"  (1.753 m)  Wt 248 lb (112.492 kg)  BMI 36.61 kg/m2  Wt Readings from Last 3 Encounters:  06/10/16 248 lb (112.492 kg)  04/27/16 249 lb 6.4 oz (113.127 kg)  12/25/14 235 lb 8 oz (106.822 kg)    Physical Exam   Constitutional: He is oriented to person, place, and time. He appears well-developed and well-nourished. No distress.  HENT:  Head: Normocephalic and atraumatic.  Neck: Neck supple. No thyromegaly present.  Cardiovascular: Normal rate, regular rhythm and normal heart sounds.  Exam reveals no gallop and no friction rub.   No murmur heard. Pulmonary/Chest: Effort normal and breath sounds normal. He has no wheezes.  Abdominal: Soft. Bowel sounds are normal. He exhibits no distension. There is no tenderness. There is no rebound.  Musculoskeletal: Normal range of motion. He exhibits no edema.       Left knee: He exhibits normal range of motion, no swelling, no effusion, no deformity, no LCL laxity and no MCL laxity. Tenderness found. Patellar tendon tenderness noted.  Neurological: He is alert and oriented to person, place, and time. He has normal reflexes.  Skin: Skin is warm and dry. No rash noted. No erythema.  Psychiatric: He has a normal mood and affect. His behavior is normal. Thought content normal.   Results for orders placed or performed in visit on 04/27/16  Comprehensive metabolic panel  Result Value Ref Range   Glucose 107 (H) 65 - 99 mg/dL   BUN 14 6 - 24 mg/dL   Creatinine, Ser 4.091.02 0.76 - 1.27 mg/dL   GFR calc non Af Amer 92 >59 mL/min/1.73   GFR calc Af Amer 106 >59 mL/min/1.73   BUN/Creatinine Ratio 14 9 - 20   Sodium 141 134 - 144  mmol/L   Potassium 4.6 3.5 - 5.2 mmol/L   Chloride 99 96 - 106 mmol/L   CO2 24 18 - 29 mmol/L   Calcium 9.0 8.7 - 10.2 mg/dL   Total Protein 7.0 6.0 - 8.5 g/dL   Albumin 4.3 3.5 - 5.5 g/dL   Globulin, Total 2.7 1.5 - 4.5 g/dL   Albumin/Globulin Ratio 1.6 1.2 - 2.2   Bilirubin Total 0.3 0.0 - 1.2 mg/dL   Alkaline Phosphatase 88 39 - 117 IU/L   AST 37 0 - 40 IU/L   ALT 57 (H) 0 - 44 IU/L  Lipid Profile  Result Value Ref Range   Cholesterol, Total 193 100 - 199 mg/dL   Triglycerides 191283 (H) 0 - 149 mg/dL   HDL 42 >47>39 mg/dL   VLDL  Cholesterol Cal 57 (H) 5 - 40 mg/dL   LDL Calculated 94 0 - 99 mg/dL   Chol/HDL Ratio 4.6 0.0 - 5.0 ratio units      Assessment & Plan:   Problem List Items Addressed This Visit      Cardiovascular and Mediastinum   HTN (hypertension) - Primary    Monitor closely. 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 Plan to increase amlodipine with continue elevated pressures. BP might be up due to pain today. Recheck 2 mos.       Relevant Medications   Blood Pressure Monitoring (BLOOD PRESSURE CUFF) MISC    Other Visit Diagnoses    Elevated transaminase level        Recheck hepatic function panel today.     Relevant Orders    Hepatic function panel    Anterior knee pain, left        Meloxicam PRN for pain. Ice and brace as needed. Return if not improving. Consider ortho referral.     Relevant Medications    meloxicam (MOBIC) 15 MG tablet       Meds ordered this encounter  Medications  . meloxicam (MOBIC) 15 MG tablet    Sig: Take 1 tablet (15 mg total) by mouth daily.    Dispense:  30 tablet    Refill:  0    Order Specific Question:  Supervising Provider    Answer:  Janeann ForehandHAWKINS JR, Markos H [phone][[id number]]  . Blood Pressure Monitoring (BLOOD PRESSURE CUFF) MISC    Sig: 1 each by Does not apply route daily.    Dispense:  1 each    Refill:  0    Order Specific Question:  Supervising Provider    Answer:  Janeann ForehandHAWKINS JR, Autry H [[id number]]      Follow up plan: Return in about 2 months (around 08/10/2016) for BP check. .Marland Kitchen

## 2016-06-10 NOTE — Patient Instructions (Signed)
Knee pain: Ice as needed for pain. Take meloxicam as needed for pain. No other advil or ibuprofen while taking meloxicam. Please let me know if knee pain continues- let me know. We will have you see an orthopedist.   Hypertension: Check blood pressure over the next few weeks. Your goal is <140/90. Please call me if your BP is consistently > 140/90.  We will plan to double your amlodipine at that time.

## 2016-06-11 LAB — HEPATITIS PANEL, ACUTE
HCV AB: NEGATIVE
HEP A IGM: NONREACTIVE
HEP B C IGM: NONREACTIVE
HEP B S AG: NEGATIVE

## 2016-08-20 ENCOUNTER — Ambulatory Visit: Payer: PRIVATE HEALTH INSURANCE | Admitting: Family Medicine

## 2016-08-31 ENCOUNTER — Ambulatory Visit: Payer: PRIVATE HEALTH INSURANCE | Admitting: Family Medicine

## 2016-09-10 ENCOUNTER — Encounter (INDEPENDENT_AMBULATORY_CARE_PROVIDER_SITE_OTHER): Payer: Self-pay

## 2016-09-10 ENCOUNTER — Encounter: Payer: Self-pay | Admitting: Family Medicine

## 2016-09-10 ENCOUNTER — Ambulatory Visit (INDEPENDENT_AMBULATORY_CARE_PROVIDER_SITE_OTHER): Payer: PRIVATE HEALTH INSURANCE | Admitting: Family Medicine

## 2016-09-10 VITALS — BP 152/80 | HR 100 | Temp 98.3°F | Resp 16 | Ht 69.0 in | Wt 249.0 lb

## 2016-09-10 DIAGNOSIS — R748 Abnormal levels of other serum enzymes: Secondary | ICD-10-CM

## 2016-09-10 DIAGNOSIS — E781 Pure hyperglyceridemia: Secondary | ICD-10-CM | POA: Diagnosis not present

## 2016-09-10 DIAGNOSIS — I10 Essential (primary) hypertension: Secondary | ICD-10-CM

## 2016-09-10 DIAGNOSIS — R079 Chest pain, unspecified: Secondary | ICD-10-CM | POA: Diagnosis not present

## 2016-09-10 DIAGNOSIS — R7301 Impaired fasting glucose: Secondary | ICD-10-CM | POA: Diagnosis not present

## 2016-09-10 DIAGNOSIS — Z720 Tobacco use: Secondary | ICD-10-CM

## 2016-09-10 DIAGNOSIS — F17200 Nicotine dependence, unspecified, uncomplicated: Secondary | ICD-10-CM

## 2016-09-10 LAB — LIPID PANEL
CHOL/HDL RATIO: 4.9 ratio (ref <=5.0)
Cholesterol: 211 mg/dL — ABNORMAL HIGH (ref 125–200)
HDL: 43 mg/dL (ref >=40)
LDL Cholesterol: 116 mg/dL (ref <130)
Triglycerides: 258 mg/dL — ABNORMAL HIGH (ref <150)
VLDL: 52 mg/dL — ABNORMAL HIGH (ref <30)

## 2016-09-10 LAB — COMPLETE METABOLIC PANEL WITH GFR
ALBUMIN: 4.4 g/dL (ref 3.6–5.1)
ALK PHOS: 68 U/L (ref 40–115)
ALT: 67 U/L — AB (ref 9–46)
AST: 44 U/L — AB (ref 10–40)
BUN: 14 mg/dL (ref 7–25)
CO2: 21 mmol/L (ref 20–31)
CREATININE: 1.03 mg/dL (ref 0.60–1.35)
Calcium: 9.9 mg/dL (ref 8.6–10.3)
Chloride: 106 mmol/L (ref 98–110)
GFR, Est African American: >89 mL/min (ref >=60)
GFR, Est Non African American: >89 mL/min (ref >=60)
GLUCOSE: 130 mg/dL — AB (ref 65–99)
POTASSIUM: 4.5 mmol/L (ref 3.5–5.3)
SODIUM: 141 mmol/L (ref 135–146)
TOTAL PROTEIN: 7.4 g/dL (ref 6.1–8.1)
Total Bilirubin: 0.2 mg/dL (ref 0.2–1.2)

## 2016-09-10 LAB — POCT GLYCOSYLATED HEMOGLOBIN (HGB A1C): Hemoglobin A1C: 5.4

## 2016-09-10 MED ORDER — AMLODIPINE BESYLATE 10 MG PO TABS: 10.0000 mg | ORAL_TABLET | Freq: Every day | ORAL | 3 refills | Status: DC

## 2016-09-10 NOTE — Patient Instructions (Signed)
Blood pressure:   I am increasing your amlodipine to 10mg  daily. Take 2 of the 5mg  pills until the bottle is empty. Continue to check your BP at home.  Your goal blood pressure is 140/90 Work on low salt/sodium diet - goal <1.5gm (1,500mg ) per day. Eat a diet high in fruits/vegetables and whole grains.  Look into mediterranean and DASH diet. Goal activity is 11550min/wk of moderate intensity exercise.  This can be split into 30 minute chunks.  If you are not at this level, you can start with smaller 10-15 min increments and slowly build up activity. Look at www.heart.org for more resources  Chest pain: Your ECG today is normal. It was likely muscular pain- however if chest pain occurs again, please go to the ER for evaluation.   Smoking cessation will reduce your risk for heart disease and may help control your blood pressure.

## 2016-09-10 NOTE — Assessment & Plan Note (Signed)
Encouraged smoking cessation. Pt has tried E-cigarettes in the past with good effect.

## 2016-09-10 NOTE — Progress Notes (Signed)
Subjective:    Patient ID: Brandon ShowsJames Fulmore, male    DOB: 1976/06/13, 40 y.o.   MRN: [id number]  HPI: Brandon Mccoy is a 40 y.o. male presenting on 09/10/2016 for Hypertension   HPI  Pt presents for hypertension follow-up. BP is elevated today. He did not take medication this morning. He ran out. He also did not smoke. 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 Checking at home. Avg- 140-150/90-95. No chest pain or visual changes. No shortness of breath.  2 months ago was having sharp L axilla pain. Sharp occurring every 5 minutes or so. He did not seek medical care at the time. It did not occur again. No SOB. No diaphoresis.  Trying to eat healthy. 1/2 pack per day smoker. Family history of heart disease- Dad- MI and  Mom has valve issues.  DId have a heart cath in 2006 when he had chest pain due to his family history. Was clear.   Taking meloxicam PRN for knee pain. Takes   Family History  Problem Relation Age of Onset  . Heart disease Father   . CVA Father   . Diabetes Father   . Diabetes Mother      Past Medical History:  Diagnosis Date  . Allergy     Current Outpatient Prescriptions on File Prior to Visit  Medication Sig  . Blood Pressure Monitoring (BLOOD PRESSURE CUFF) MISC 1 each by Does not apply route daily.  Marland Kitchen. lisinopril (PRINIVIL,ZESTRIL) 10 MG tablet Reported on 04/27/2016  . meloxicam (MOBIC) 15 MG tablet Take 1 tablet (15 mg total) by mouth daily.   No current facility-administered medications on file prior to visit.     Review of Systems  Constitutional: Negative for chills and fever.  HENT: Negative.   Respiratory: Negative for chest tightness, shortness of breath and wheezing.   Cardiovascular: Positive for chest pain (2 mos ago. ). Negative for palpitations and leg swelling.  Gastrointestinal: Negative for abdominal pain, nausea and vomiting.  Endocrine: Negative.   Genitourinary: Negative for discharge,  dysuria, penile pain, testicular pain and urgency.  Musculoskeletal: Negative for arthralgias, back pain and joint swelling.  Skin: Negative.   Neurological: Negative for dizziness, weakness, numbness and headaches.  Psychiatric/Behavioral: Negative for dysphoric mood and sleep disturbance.   Per HPI unless specifically indicated above     Objective:    BP (!) 152/80   Pulse 100   Temp 98.3 F (36.8 C) (Oral)   Resp 16   Ht 5\' 9"  (1.753 m)   Wt 249 lb (112.9 kg)   BMI 36.77 kg/m   Wt Readings from Last 3 Encounters:  09/10/16 249 lb (112.9 kg)  06/10/16 248 lb (112.5 kg)  04/27/16 249 lb 6.4 oz (113.1 kg)    Physical Exam  Constitutional: He is oriented to person, place, and time. He appears well-developed and well-nourished. No distress.  HENT:  Head: Normocephalic and atraumatic.  Neck: Neck supple. No thyromegaly present.  Cardiovascular: Normal rate, regular rhythm and normal heart sounds.  Exam reveals no gallop and no friction rub.   No murmur heard. Pulmonary/Chest: Effort normal and breath sounds normal. He has no wheezes.  Abdominal: Soft. Bowel sounds are normal. He exhibits no distension. There is no tenderness. There is no rebound.  Musculoskeletal: Normal range of motion. He exhibits no edema or tenderness.  Neurological: He is alert and oriented to person, place, and time. He has normal reflexes.  Skin:  Skin is warm and dry. No rash noted. No erythema.  Psychiatric: He has a normal mood and affect. His behavior is normal. Thought content normal.   Results for orders placed or performed in visit on 09/10/16  POCT HgB A1C  Result Value Ref Range   Hemoglobin A1C 5.4       Assessment & Plan:   Problem List Items Addressed This Visit      Cardiovascular and Mediastinum   HTN (hypertension) - Primary    Increase amlodipine to 10mg  daily due to elevated BP at home. Recheck CMP today. Encouraged DASH diet and smoking cessation. Recheck 3 weeks.        Relevant Medications   amLODipine (NORVASC) 10 MG tablet   Other Relevant Orders   COMPLETE METABOLIC PANEL WITH GFR     Other   Smoker    Encouraged smoking cessation. Pt has tried E-cigarettes in the past with good effect.       Relevant Orders   Nurse to provide smoking / tobacco cessation education   Elevated liver enzymes    Recheck CMP today. Likely meloxicam related. If still elevated- will plan to obtain liver US.        Other Visit Diagnoses    Elevated fasting blood sugar       A1c in normal range.    Relevant Orders   POCT HgB A1C (Completed)   Left sided chest pain       Likely MSK. Difficult to work-up today 2/2 symptoms occurring so long ago. Discussed cardiology referral- pt would like to wait at this time. Labs to stratify risk factors. Encouraged smoking cessation. Pt aware to go to the ER if chest pain occurs again. Recheck 3 weeks.    Relevant Orders   EKG 12-Lead   Hypertriglyceridemia       Recheck lipids today.    Relevant Medications   amLODipine (NORVASC) 10 MG tablet   Other Relevant Orders   Lipid Profile      Meds ordered this encounter  Medications  . amLODipine (NORVASC) 10 MG tablet    Sig: Take 1 tablet (10 mg total) by mouth daily.    Dispense:  90 tablet    Refill:  3    Order Specific Question:   Supervising Provider    Answer:   RIDER, ERMIS [[id number]]      Follow up plan: Return in about 3 weeks (around 10/01/2016) for BP check. Marland Kitchen

## 2016-09-10 NOTE — Assessment & Plan Note (Signed)
Increase amlodipine to 10mg  daily due to elevated BP at home. Recheck CMP today. Encouraged DASH diet and smoking cessation. Recheck 3 weeks.

## 2016-09-10 NOTE — Assessment & Plan Note (Signed)
Recheck CMP today. Likely meloxicam related. If still elevated- will plan to obtain liver US.

## 2016-09-13 ENCOUNTER — Other Ambulatory Visit: Payer: Self-pay | Admitting: Family Medicine

## 2016-09-13 DIAGNOSIS — R748 Abnormal levels of other serum enzymes: Secondary | ICD-10-CM

## 2016-09-14 ENCOUNTER — Ambulatory Visit: Payer: PRIVATE HEALTH INSURANCE | Admitting: Family Medicine

## 2016-09-17 ENCOUNTER — Ambulatory Visit: Payer: No Typology Code available for payment source

## 2016-10-01 ENCOUNTER — Ambulatory Visit: Payer: Commercial Managed Care - PPO

## 2016-10-01 ENCOUNTER — Ambulatory Visit: Payer: PRIVATE HEALTH INSURANCE | Admitting: Family Medicine

## 2016-10-05 ENCOUNTER — Ambulatory Visit: Payer: No Typology Code available for payment source

## 2016-10-05 ENCOUNTER — Ambulatory Visit: Payer: PRIVATE HEALTH INSURANCE | Admitting: Family Medicine

## 2016-10-19 ENCOUNTER — Encounter: Payer: Self-pay | Admitting: Family Medicine

## 2016-10-19 ENCOUNTER — Ambulatory Visit: Admission: RE | Admit: 2016-10-19 | Payer: No Typology Code available for payment source | Source: Ambulatory Visit

## 2016-10-19 ENCOUNTER — Ambulatory Visit (INDEPENDENT_AMBULATORY_CARE_PROVIDER_SITE_OTHER): Payer: PRIVATE HEALTH INSURANCE | Admitting: Family Medicine

## 2016-10-19 VITALS — BP 128/70 | HR 74 | Temp 98.3°F | Resp 16 | Ht 69.0 in | Wt 249.0 lb

## 2016-10-19 DIAGNOSIS — E782 Mixed hyperlipidemia: Secondary | ICD-10-CM

## 2016-10-19 DIAGNOSIS — E669 Obesity, unspecified: Secondary | ICD-10-CM

## 2016-10-19 DIAGNOSIS — I10 Essential (primary) hypertension: Secondary | ICD-10-CM

## 2016-10-19 DIAGNOSIS — R748 Abnormal levels of other serum enzymes: Secondary | ICD-10-CM | POA: Diagnosis not present

## 2016-10-19 DIAGNOSIS — Z720 Tobacco use: Secondary | ICD-10-CM | POA: Diagnosis not present

## 2016-10-19 DIAGNOSIS — E785 Hyperlipidemia, unspecified: Secondary | ICD-10-CM | POA: Insufficient documentation

## 2016-10-19 NOTE — Assessment & Plan Note (Signed)
Active smoker, 0.5ppd >2 yrs Not ready to quit Smoking cessation counseling today

## 2016-10-19 NOTE — Progress Notes (Signed)
Subjective:    Patient ID: Brandon Mccoy, male    DOB: 12/14/1976, 40 y.o.   MRN: [id number]  Brandon Mccoy is a 40 y.o. male presenting on 10/19/2016 for Hypertension  HPI  CHRONIC HTN: Reports last visit 09/10/16 for BP follow-up had elevated BP, admits to prior history of some missed anti-HTN meds. Current Meds - Amlodipine 10mg  daily (last dose at 0100, usually takes within 24-26 hours, previously on 5mg , recently increased at last visit), no other HTN meds previously. He has a BP cuff at home does not check regularly. Reports good compliance, took meds today. Tolerating well, w/o complaints. 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, he does have a fridge in truck and can bring some fresh vegetables, likes celery, drinks mostly water, no salt added) - Family history of HTN, DM Denies CP, dyspnea, HA, edema, dizziness / lightheadedness  HLD / OBESITY BMI >36 - Last fasting lipid panel 08/2016, with elevated TG and total cholesterol - Wt stable, down 1 lb in 1 month, overall has gained +10-15 lbs in past 2 years  Elevated LFTs / H/l Alcohol Use  - Reports he had several alcoholic mixed drinks night before he came in for blood work last time and is concerned that this may have worsened his LFTs, now drinks about 1-5 mixed drinks weekly usually all on one day when home after driving for work. Previously heavier drinker, but has reduced. No prior history of alcohol detox or withdrawal symptoms - He was scheduled for abdominal US for eval of liver today, but he missed this appointment by accident, and will contact them to re-schedule  Low Back Injury - Currently being followed by other physicians for worker's comp low back lifting injury at work. He reviews this course today but we are not actively managing this problem. - Describes recent injury on 10/03/16 at work involving lifting, initially went to [facility],  treated with ibuprofen 800mg  and baclofen, going to employee outpatient PT, has followed up with worker's comp doctors for this problem. Has not been released back to work yet  TOBACCO ABUSE: - Chronic problem for past about 20 years, around 0.5ppd daily, has tapered down with trial on e-cigs, has not successfully quit  Social History  Substance Use Topics  . Smoking status: Current Every Day Smoker    Packs/day: 0.50    Types: Cigarettes  . Smokeless tobacco: Not on file  . Alcohol use Yes     Comment: regularly    Review of Systems Per HPI unless specifically indicated above     Objective:    BP 128/70 (BP Location: Left Arm, Cuff Size: Normal)   Pulse 74   Temp 98.3 F (36.8 C) (Oral)   Resp 16   Ht 5\' 9"  (1.753 m)   Wt 249 lb (112.9 kg)   BMI 36.77 kg/m   Wt Readings from Last 3 Encounters:  10/19/16 249 lb (112.9 kg)  09/10/16 249 lb (112.9 kg)  06/10/16 248 lb (112.5 kg)    Physical Exam  Constitutional: He appears well-developed and well-nourished. No distress.  Well-appearing, comfortable, cooperative, overweight  HENT:  Head: Normocephalic and atraumatic.  Mouth/Throat: Oropharynx is clear and moist.  Eyes: Conjunctivae are normal. Pupils are equal, round, and reactive to light.  Neck: Normal range of motion. Neck supple. No thyromegaly present.  Cardiovascular: Normal rate, regular rhythm, normal heart sounds and intact distal pulses.  No murmur heard. Pulmonary/Chest: Effort normal and breath sounds normal. No respiratory distress. He has no wheezes. He has no rales.  Musculoskeletal: He exhibits no edema.  Lymphadenopathy:    He has no cervical adenopathy.  Neurological: He is alert.  Skin: Skin is warm and dry. He is not diaphoretic.  Nursing note and vitals reviewed.  I have personally reviewed the following lab results from 09/10/16.  Results for orders placed or performed in visit on 09/10/16  COMPLETE METABOLIC PANEL WITH GFR  Result Value Ref  Range   Sodium 141 135 - 146 mmol/L   Potassium 4.5 3.5 - 5.3 mmol/L   Chloride 106 98 - 110 mmol/L   CO2 21 20 - 31 mmol/L   Glucose, Bld 130 (H) 65 - 99 mg/dL   BUN 14 7 - 25 mg/dL   Creat 1.61 0.96 - 0.45 mg/dL   Total Bilirubin 0.2 0.2 - 1.2 mg/dL   Alkaline Phosphatase 68 40 - 115 U/L   AST 44 (H) 10 - 40 U/L   ALT 67 (H) 9 - 46 U/L   Total Protein 7.4 6.1 - 8.1 g/dL   Albumin 4.4 3.6 - 5.1 g/dL   Calcium 9.9 8.6 - 40.9 mg/dL   GFR, Est African American >89 >=60 mL/min   GFR, Est Non African American >89 >=60 mL/min  Lipid Profile  Result Value Ref Range   Cholesterol 211 (H) 125 - 200 mg/dL   Triglycerides 811 (H) <150 mg/dL   HDL 43 >=91 mg/dL   Total CHOL/HDL Ratio 4.9 <=5.0 Ratio   VLDL 52 (H) <30 mg/dL   LDL Cholesterol 478 <295 mg/dL  POCT HgB A2Z  Result Value Ref Range   Hemoglobin A1C 5.4       Assessment & Plan:   Problem List Items Addressed This Visit    Tobacco abuse    Active smoker, 0.5ppd >2 yrs Not ready to quit Smoking cessation counseling today      Obesity (BMI 35.0-39.9 without comorbidity)   HTN (hypertension) - Primary    Initial mild elevated BP, improved on manual re-check at end of visit. Also some mild low back pain from recent injury Seems improved control on Amlodipine 10mg  daily, increased 1 month ago  Plan: 1. Continue current Amlodipine 10mg  today 2. Check BP regularly outside office, record log, bring to next visit 3. 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 Smoking cessation counseling, advised recommend reduce alcohol consumption 5. Follow-up 1 month BP re-check, may need low dose additional thiazide vs ACE if not optimally controlled      HLD (hyperlipidemia)     Elevated TG on last lipid panel 08/2016. - Calculated ASCVD 10 yr risk up to 6.4% as smoker, and treated for BP. However if non-smoker down to 2% - No indication for statin therapy at this time, regardless of liver, awaiting on liver US, suspected fatty liver - Lifestyle modifications      Elevated liver enzymes    Persisetntly elevated LFTs on last 3 chemistry panels, since 04/2016, last result 08/2016 patient admittedly had several mixed drinks night before, perhaps this may have increased but regardless, consistent elevated LFT trend - Suspect fatty liver vs less likely alcoholic hepatic injury  Plan: 1. Advised patient to re-schedule his abdominal US to eval liver, he missed this apt today 2. Reduce alcohol consumption, improve healthy diet and exercise 3. Follow-up       Other Visit Diagnoses   None.         Follow up plan: Return in about 4 weeks (around 11/16/2016) for blood pressure.  Saralyn PilarAlexander Karamalegos, DO [facility] 10/19/2016, 5:44 PM

## 2016-10-19 NOTE — Assessment & Plan Note (Signed)
Elevated TG on last lipid panel 08/2016. - Calculated ASCVD 10 yr risk up to 6.4% as smoker, and treated for BP. However if non-smoker down to 2% - No indication for statin therapy at this time, regardless of liver, awaiting on liver US, suspected fatty liver - Lifestyle modifications

## 2016-10-19 NOTE — Assessment & Plan Note (Signed)
Initial mild elevated BP, improved on manual re-check at end of visit. Also some mild low back pain from recent injury Seems improved control on Amlodipine 10mg  daily, increased 1 month ago  Plan: 1. Continue current Amlodipine 10mg  today 2. Check BP regularly outside office, record log, bring to next visit 3. 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 Smoking cessation counseling, advised recommend reduce alcohol consumption 5. Follow-up 1 month BP re-check, may need low dose additional thiazide vs ACE if not optimally controlled

## 2016-10-19 NOTE — Assessment & Plan Note (Signed)
Persisetntly elevated LFTs on last 3 chemistry panels, since 04/2016, last result 08/2016 patient admittedly had several mixed drinks night before, perhaps this may have increased but regardless, consistent elevated LFT trend - Suspect fatty liver vs less likely alcoholic hepatic injury  Plan: 1. Advised patient to re-schedule his abdominal US to eval liver, he missed this apt today 2. Reduce alcohol consumption, improve healthy diet and exercise 3. Follow-up

## 2016-10-19 NOTE — Patient Instructions (Signed)
Thank you for coming in to clinic today.  1. BP is improved on re-check today. I think that the Amodipine 10mg  daily is working well. We do need more data as discussed. Keep track of BP various times of day at home, more restful environment. At first can check daily or twice a day then space it out every other day if BP numbers are good. - Bring sheet to next visit  Factors that affect BP - Smoking, try to cut back and quit - Alcohol, even small amount but regular consumption can raise BP, and this will help your liver regardless - Low sodium diet, this is separate from the Diabetic Diet recommendations with low carb, but look at food labels, and inc fresh vegetables - Regular exercise, as discussed this may be most difficult, even if you could do 1 day a week of cardiovascular aerobic exercise such as walking 30 min this would certainly help  Re schedule your abdominal ultrasound  [facility] Address: [address], [place], [place] [address] Phone: [phone]  Eat at least 3 meals and 1-2 snacks per day (don't skip breakfast).  Aim for no more than 5 hours between eating. - Tip: If you go >5 hours without eating and become very hungry, your body will supply it's own resources temporarily and you can gain extra weight when you eat.  The 5 Minute Rule of Exercise - Promise yourself to at least do 5 minutes of exercise (make sure you time it), and if at the end of 5 minutes (this is the hardest part of the work-out), if you still feel like you want stop (or not motivated to continue) then allow yourself to stop. Otherwise, more often than not you will feel encouraged that you can continue for a little while longer or even more!  Diet Recommendations for Diabetes - Low Carb Diet (good to follow for weight loss in general)   Starchy (carb) foods include: Bread, rice, pasta, potatoes, corn, crackers, bagels, muffins, all baked goods.   Protein foods  include: Meat, fish, poultry, eggs, dairy foods, and beans such as pinto and kidney beans (beans also provide carbohydrate).   1. Eat at least 3 meals and 1-2 snacks per day. Never go more than 4-5 hours while awake without eating.   2. Limit starchy foods to TWO per meal and ONE per snack. ONE portion of a starchy  food is equal to the following:   - ONE slice of bread (or its equivalent, such as half of a hamburger bun).   - 1/2 cup of a "scoopable" starchy food such as potatoes or rice.   - 1 OUNCE (28 grams) of starchy snacks (crackers or pretzels, look on label).   - 15 grams of carbohydrate as shown on food label.   3. Both lunch and dinner should include a protein food, a carb food, and vegetables.   - Obtain twice as many veg's as protein or carbohydrate foods for both lunch and dinner.   - Try to keep frozen veg's on hand for a quick vegetable serving.     - Fresh or frozen veg's are best.   4. Breakfast should always include protein.  Please schedule a follow-up appointment with Dr. Althea CharonKaramalegos in 1 month for BP follow-up  If you have any other questions or concerns, please feel free to call the clinic or send a message through MyChart. You may also schedule an earlier appointment if necessary.  Saralyn PilarAlexander Karamalegos,  [place]

## 2016-11-19 ENCOUNTER — Ambulatory Visit: Payer: PRIVATE HEALTH INSURANCE | Admitting: Family Medicine

## 2016-12-28 ENCOUNTER — Ambulatory Visit: Payer: PRIVATE HEALTH INSURANCE | Admitting: Family Medicine

## 2017-04-04 ENCOUNTER — Other Ambulatory Visit: Payer: Self-pay | Admitting: Family Medicine

## 2017-04-04 DIAGNOSIS — I10 Essential (primary) hypertension: Secondary | ICD-10-CM

## 2017-04-04 MED ORDER — AMLODIPINE BESYLATE 10 MG PO TABS: 10.0000 mg | ORAL_TABLET | Freq: Every day | ORAL | 3 refills | Status: DC

## 2017-04-26 ENCOUNTER — Ambulatory Visit (INDEPENDENT_AMBULATORY_CARE_PROVIDER_SITE_OTHER): Payer: PRIVATE HEALTH INSURANCE | Admitting: Nurse Practitioner

## 2017-04-26 ENCOUNTER — Encounter: Payer: Self-pay | Admitting: Nurse Practitioner

## 2017-04-26 ENCOUNTER — Ambulatory Visit: Payer: PRIVATE HEALTH INSURANCE | Admitting: Family Medicine

## 2017-04-26 VITALS — BP 142/81 | HR 85 | Temp 98.5°F | Resp 16 | Ht 69.0 in | Wt 250.0 lb

## 2017-04-26 DIAGNOSIS — R0789 Other chest pain: Secondary | ICD-10-CM | POA: Diagnosis not present

## 2017-04-26 NOTE — Patient Instructions (Addendum)
Brandon Mccoy, Thank you for coming in to clinic today. For your Chest Pain:  1. We will proceed with a cardiology referral.  If you symptoms worsen in the next days with increasing chest pain, shortness of breath, sweating, or lightheadedness seek care at urgent care or ED for further workup.    2. Your ECG is normal.  Normal Sinus Rhythm  3. If this is musculoskeletal, we will treat with anti-inflammatory pain medications.  Take ibuprofen 200-400 mg every 6-8 hrs or your ibuprofen 800 mg once every 12 hours. - Start taking Tylenol extra strength 1 to 2 tablets every 6-8 hours for aches as needed.  Do not take more than 3,000 mg in 24 hours from all medicines.  STOP after 14 days.  Please schedule a follow-up appointment with Wilhelmina Mcardle, AGNP to Return in about 3 months (around 07/27/2017) for annual physical.  If you have any other questions or concerns, please feel free to call the clinic or send a message through MyChart. You may also schedule an earlier appointment if necessary.  Wilhelmina Mcardle, DNP, AGNP-BC Adult Gerontology Nurse Practitioner [facility], [facility]   Nonspecific Chest Pain Chest pain can be caused by many different conditions. There is always a chance that your pain could be related to something serious, such as a heart attack or a blood clot in your lungs. Chest pain can also be caused by conditions that are not life-threatening. If you have chest pain, it is very important to follow up with your health care provider. What are the causes? Causes of this condition include:  Heartburn.  Pneumonia or bronchitis.  Anxiety or stress.  Inflammation around your heart (pericarditis) or lung (pleuritis or pleurisy).  A blood clot in your lung.  A collapsed lung (pneumothorax). This can develop suddenly on its own (spontaneous pneumothorax) or from trauma to the chest.  Shingles infection (varicella-zoster virus).  Heart attack.  Damage to the bones,  muscles, and cartilage that make up your chest wall. This can include:  Bruised bones due to injury.  Strained muscles or cartilage due to frequent or repeated coughing or overwork.  Fracture to one or more ribs.  Sore cartilage due to inflammation (costochondritis). What increases the risk? Risk factors for this condition may include:  Activities that increase your risk for trauma or injury to your chest.  Respiratory infections or conditions that cause frequent coughing.  Medical conditions or overeating that can cause heartburn.  Heart disease or family history of heart disease.  Conditions or health behaviors that increase your risk of developing a blood clot.  Having had chicken pox (varicella zoster). What are the signs or symptoms? Chest pain can feel like:  Burning or tingling on the surface of your chest or deep in your chest.  Crushing, pressure, aching, or squeezing pain.  Dull or sharp pain that is worse when you move, cough, or take a deep breath.  Pain that is also felt in your back, neck, shoulder, or arm, or pain that spreads to any of these areas. Your chest pain may come and go, or it may stay constant. How is this diagnosed? Lab tests or other studies may be needed to find the cause of your pain. Your health care provider may have you take a test called an ECG (electrocardiogram). An ECG records your heartbeat patterns at the time the test is performed. You may also have other tests, such as:  Transthoracic echocardiogram (TTE). In this test, sound waves are  used to create a picture of the heart structures and to look at how blood flows through your heart.  Transesophageal echocardiogram (TEE).This is a more advanced imaging test that takes images from inside your body. It allows your health care provider to see your heart in finer detail.  Cardiac monitoring. This allows your health care provider to monitor your heart rate and rhythm in real  time.  Holter monitor. This is a portable device that records your heartbeat and can help to diagnose abnormal heartbeats. It allows your health care provider to track your heart activity for several days, if needed.  Stress tests. These can be done through exercise or by taking medicine that makes your heart beat more quickly.  Blood tests.  Other imaging tests. How is this treated? Treatment depends on what is causing your chest pain. Treatment may include:  Medicines. These may include:  Acid blockers for heartburn.  Anti-inflammatory medicine.  Pain medicine for inflammatory conditions.  Antibiotic medicine, if an infection is present.  Medicines to dissolve blood clots.  Medicines to treat coronary artery disease (CAD).  Supportive care for conditions that do not require medicines. This may include:  Resting.  Applying heat or cold packs to injured areas.  Limiting activities until pain decreases. Follow these instructions at home: Medicines   If you were prescribed an antibiotic, take it as told by your health care provider. Do not stop taking the antibiotic even if you start to feel better.  Take over-the-counter and prescription medicines only as told by your health care provider. Lifestyle   Do not use any products that contain nicotine or tobacco, such as cigarettes and e-cigarettes. If you need help quitting, ask your health care provider.  Do not drink alcohol.  Make lifestyle changes as directed by your health care provider. These may include:  Getting regular exercise. Ask your health care provider to suggest some activities that are safe for you.  Eating a heart-healthy diet. A registered dietitian can help you to learn healthy eating options.  Maintaining a healthy weight.  Managing diabetes, if necessary.  Reducing stress, such as with yoga or relaxation techniques. General instructions   Avoid any activities that bring on chest pain.  If  heartburn is the cause for your chest pain, raise (elevate) the head of your bed about 6 inches (15 cm) by putting blocks under the legs. Sleeping with more pillows does not effectively relieve heartburn because it only changes the position of your head.  Keep all follow-up visits as told by your health care provider. This is important. This includes any further testing if your chest pain does not go away. Contact a health care provider if:  Your chest pain does not go away.  You have a rash with blisters on your chest.  You have a fever.  You have chills. Get help right away if:  Your chest pain is worse.  You have a cough that gets worse, or you cough up blood.  You have severe pain in your abdomen.  You have severe weakness.  You faint.  You have sudden, unexplained chest discomfort.  You have sudden, unexplained discomfort in your arms, back, neck, or jaw.  You have shortness of breath at any time.  You suddenly start to sweat, or your skin gets clammy.  You feel nauseous or you vomit.  You suddenly feel light-headed or dizzy.  Your heart begins to beat quickly, or it feels like it is skipping beats. These symptoms  may represent a serious problem that is an emergency. Do not wait to see if the symptoms will go away. Get medical help right away. Call your local emergency services (911 in the [place] Do not drive yourself to the hospital. This information is not intended to replace advice given to you by your health care provider. Make sure you discuss any questions you have with your health care provider. Document Released: 09/15/2005 Document Revised: 08/30/2016 Document Reviewed: 08/30/2016 [employer] Interactive Patient Education  2017 [employer]

## 2017-04-26 NOTE — Progress Notes (Signed)
Subjective:    Patient ID: Brandon Mccoy, male    DOB: [birth year], 41 y.o.   MRN: [id number]  Brandon Mccoy is a 41 y.o. 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  Pain is a dull ache that comes and goes for past couple of years.  Sunday night Monday am 2am was driving his tractor trailer and put L arm above head and taking deep breaths.  Felt like he might pass out, became flushed w/ face red and sweaty with pain radiating up to neck .  Pectoral pain persists now as a dull ache with occasional sharp pains.  Some labored breathing occasionally.  Seen 1.5 years ago for same issue under muscle and was told large intestine may have been blocked.    He has taken aspirin 325 mg tablet 2 pills every 4-6 hours.  Some relief and notices when it starts to wear off.  He does have acid reflux at night, but notes this pain is different. No history of DVT.  No lower leg swelling/tenderness.   Early heart disease in family. Walked [place] Saturday prior to incident with no shortness of breath.   Legs with eczema on physical exam: Uses a product called simple sugars from [employer] for management.  Pt has no concerns today about this.  Social History  Substance Use Topics  . Smoking status: Current Every Day Smoker    Packs/day: 0.50    Types: Cigarettes  . Smokeless tobacco: Current User  . Alcohol use Yes     Comment: regularly    Review of Systems Per HPI unless specifically indicated above     Objective:    BP (!) 142/81   Pulse 85   Temp 98.5 F (36.9 C) (Oral)   Resp 16   Ht 5\' 9"  (1.753 m)   Wt 250 lb (113.4 kg)   BMI 36.92 kg/m    BP recheck 130/78  12-lead ECG: NSR   Wt Readings from Last 3 Encounters:  04/26/17 250  lb (113.4 kg)  10/19/16 249 lb (112.9 kg)  09/10/16 249 lb (112.9 kg)    Physical Exam  Constitutional: He is oriented to person, place, and time. He appears well-developed and well-nourished. No distress.  HENT:  Head: Normocephalic and atraumatic.  Eyes: Conjunctivae and EOM are normal. Pupils are equal, round, and reactive to light.  Neck: Normal range of motion. Neck supple. No JVD present. No tracheal deviation present.  Cardiovascular: Normal rate, regular rhythm, normal heart sounds and intact distal pulses.   No murmur heard. Pulmonary/Chest: Effort normal and breath sounds normal. No respiratory distress. He has no wheezes. He exhibits no tenderness.  Abdominal: Soft. Bowel sounds are normal. He exhibits no distension and no mass. There is no tenderness. There is no rebound and no guarding.  Musculoskeletal: Normal range of motion. He exhibits no edema.       Right lower leg: Normal. He exhibits no tenderness, no swelling and no edema.       Left lower leg: He exhibits no tenderness, no swelling and no edema.  Lymphadenopathy:    He has no cervical adenopathy.  Neurological: He is alert and oriented to person, place,  and time.  Skin: Skin is warm and dry.  Eczema anterior aspect of BLE below knee  Psychiatric: He has a normal mood and affect. His behavior is normal. Judgment and thought content normal.   Results for orders placed or performed in visit on 09/10/16  COMPLETE METABOLIC PANEL WITH GFR  Result Value Ref Range   Sodium 141 135 - 146 mmol/L   Potassium 4.5 3.5 - 5.3 mmol/L   Chloride 106 98 - 110 mmol/L   CO2 21 20 - 31 mmol/L   Glucose, Bld 130 (H) 65 - 99 mg/dL   BUN 14 7 - 25 mg/dL   Creat 1.611.03 0.960.60 - 0.451.35 mg/dL   Total Bilirubin 0.2 0.2 - 1.2 mg/dL   Alkaline Phosphatase 68 40 - 115 U/L   AST 44 (H) 10 - 40 U/L   ALT 67 (H) 9 - 46 U/L   Total Protein 7.4 6.1 - 8.1 g/dL   Albumin 4.4 3.6 - 5.1 g/dL   Calcium 9.9 8.6 - 40.910.3 mg/dL   GFR, Est African  American >89 >=60 mL/min   GFR, Est Non African American >89 >=60 mL/min  Lipid Profile  Result Value Ref Range   Cholesterol 211 (H) 125 - 200 mg/dL   Triglycerides 811258 (H) <150 mg/dL   HDL 43 >=91>=40 mg/dL   Total CHOL/HDL Ratio 4.9 <=5.0 Ratio   VLDL 52 (H) <30 mg/dL   LDL Cholesterol 478116 <295<130 mg/dL  POCT HgB A2ZA1C  Result Value Ref Range   Hemoglobin A1C 5.4       Assessment & Plan:   Problem List Items Addressed This Visit    None    Visit Diagnoses    Other chest pain    -  Primary Chest pain of unknown cause.  Reduced severity, responding minimally to NSAIDs.  Possible MSK cause, however risks for cardiac disease present.  Plan: 1. Referrral to Cardiology for workup. 2. ECG today in clinic with NSR. 3. Continue NSAID therapy for now as needed. Discussed ibuprofen instead of Aspirin.  Can alternate ibuprofen/acetaminophen for 2 weeks.   4. 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   Relevant Orders   EKG 12-Lead   Ambulatory referral to Cardiology      No orders of the defined types were placed in this encounter.     Follow up plan: Return in about 3 months (around 07/27/2017) for annual physical. and as needed for persistent or worsening symptoms.    Wilhelmina McardleLauren Aleeta Schmaltz, DNP, AGPCNP-BC Adult Gerontology Primary Care Nurse Practitioner [facility] 04/26/2017, 4:35 PM

## 2017-04-27 ENCOUNTER — Telehealth: Payer: Self-pay | Admitting: Nurse Practitioner

## 2017-04-27 NOTE — Telephone Encounter (Signed)
Confirm that he can have a work note.  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

## 2017-04-27 NOTE — Progress Notes (Signed)
I have reviewed this encounter including the documentation in this note and/or discussed this patient with the provider, Lauren Kennedy, AGPCNP-BC. I am certifying that I agree with the content of this note as supervising physician.  Tasheem Elms, DO [facility] 04/27/2017, 9:15 PM  

## 2017-04-27 NOTE — Telephone Encounter (Signed)
Pt forgot to get note to be out of work until he sees cardio.  His call back number is [phone]

## 2017-04-27 NOTE — Telephone Encounter (Signed)
Letter was faxed.

## 2017-05-06 ENCOUNTER — Ambulatory Visit: Payer: PRIVATE HEALTH INSURANCE | Admitting: Nurse Practitioner

## 2017-05-10 ENCOUNTER — Encounter: Payer: Self-pay | Admitting: Cardiovascular Disease

## 2017-05-10 ENCOUNTER — Ambulatory Visit (INDEPENDENT_AMBULATORY_CARE_PROVIDER_SITE_OTHER): Payer: Commercial Managed Care - PPO | Admitting: Cardiovascular Disease

## 2017-05-10 VITALS — BP 122/82 | HR 92 | Ht 69.0 in | Wt 248.0 lb

## 2017-05-10 DIAGNOSIS — R079 Chest pain, unspecified: Secondary | ICD-10-CM | POA: Diagnosis not present

## 2017-05-10 DIAGNOSIS — Z720 Tobacco use: Secondary | ICD-10-CM

## 2017-05-10 DIAGNOSIS — I10 Essential (primary) hypertension: Secondary | ICD-10-CM

## 2017-05-10 NOTE — Progress Notes (Signed)
Cardiology Office Note   Date:  05/10/2017   ID:  Brandon Mccoy, DOB 11-17-1976, MRN [id number]  PCP:  Smitty Cords, DO  Cardiologist:   Lorine Bears, MD   Chief Complaint  Patient presents with  . Other    Former Dr. Elease Hashimoto patient in the past. Meds reviewed by the pt. verbally. Pt. c/o shortness of breath, dizziness, lightheaded, felt like could black out several times and chest pain.       History of Present Illness: Brandon Mccoy is a 41 y.o. male who was referred by Dr. Althea Charon for evaluation of chest pain. He has known history of hypertension, obesity and tobacco use. He had cardiac catheterization done in 2010 which showed normal coronary arteries and ejection fraction. He was seen by Dr.Nahser in 2016 for chest pain which was felt to be atypical and did not require further testing. The patient has family history of coronary artery disease. His father had myocardial infarction in his late 56s. 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 The pain is left sided described as dull, aching and sharp sensation with no radiation. It's mostly triggered by certain positions and movements. It does not get worse with other physical activities such as walking. There is no associated shortness of breath. He had associated dizziness with no syncope. He cut down on smoking since then.    Past Medical History:  Diagnosis Date  . Allergy   . Hypertension     Past Surgical History:  Procedure Laterality Date  . ANKLE SURGERY    . HAND SURGERY    . HERNIA REPAIR       Current Outpatient Prescriptions  Medication Sig Dispense Refill  . amLODipine (NORVASC) 10 MG tablet Take 1 tablet (10 mg total) by mouth daily. 90 tablet 3  . baclofen (LIORESAL) 10 MG tablet TK 1 T PO TID PRF MSP  0  . Blood Pressure Monitoring (BLOOD PRESSURE CUFF) MISC 1 each by Does not apply route daily. 1 each 0  . ibuprofen  (ADVIL,MOTRIN) 800 MG tablet TK 1 T PO TID PRN P AND SWELLING  0   No current facility-administered medications for this visit.     Allergies:   Penicillins    Social History:  The patient  reports that he has been smoking Cigarettes.  He has been smoking about 0.50 packs per day. He has never used smokeless tobacco. He reports that he drinks alcohol. He reports that he does not use drugs.   Family History:  The patient's family history includes CVA in his father; Diabetes in his father and mother; Heart attack (age of onset: 33) in his father; Heart disease in his father.    ROS:  Please see the history of present illness.   Otherwise, review of systems are positive for none.   All other systems are reviewed and negative.    PHYSICAL EXAM: VS:  BP 122/82 (BP Location: Right Arm, Patient Position: Sitting, Cuff Size: Normal)   Pulse 92   Ht 5\' 9"  (1.753 m)   Wt 248 lb (112.5 kg)   BMI 36.62 kg/m  , BMI Body mass index is 36.62 kg/m. GEN: Well nourished, well developed, in no acute distress  HEENT: normal  Neck: no JVD, carotid bruits, or masses Cardiac: RRR; no murmurs, rubs, or gallops,no edema  Respiratory:  clear to auscultation bilaterally, normal work of breathing GI: soft, nontender, nondistended, + BS  MS: no deformity or atrophy  Skin: warm and dry, no rash Neuro:  Strength and sensation are intact Psych: euthymic mood, full affect   EKG:  EKG is ordered today. The ekg ordered today demonstrates normal sinus rhythm with no significant ST or T wave changes.   Recent Labs: 09/10/2016: ALT 67; BUN 14; Creat 1.03; Potassium 4.5; Sodium 141    Lipid Panel    Component Value Date/Time   CHOL 211 (H) 09/10/2016 1101   CHOL 193 04/28/2016 1100   TRIG 258 (H) 09/10/2016 1101   HDL 43 09/10/2016 1101   HDL 42 04/28/2016 1100   CHOLHDL 4.9 09/10/2016 1101   VLDL 52 (H) 09/10/2016 1101   LDLCALC 116 09/10/2016 1101   LDLCALC 94 04/28/2016 1100      Wt Readings  from Last 3 Encounters:  05/10/17 248 lb (112.5 kg)  04/26/17 250 lb (113.4 kg)  10/19/16 249 lb (112.9 kg)        PAD Screen 05/10/2017  Previous PAD dx? No  Previous surgical procedure? No  Pain with walking? No  Feet/toe relief with dangling? No  Painful, non-healing ulcers? No  Extremities discolored? No      ASSESSMENT AND PLAN:  1.  Atypical chest pain: Likely musculoskeletal based on the description. Cardiac exam is unremarkable and baseline EKG is normal. Given his risk factors for coronary artery disease, I requested a treadmill stress test for evaluation. The patient has been out of work since he started having chest pain. He can resume work once stress test is normal. Episodes of dizziness in the setting of severe pain likely a vasovagal reaction.  2. Essential hypertension: Blood pressure is controlled.  3. Tobacco use: He cut down on smoking and I advised smoking cessation.    Disposition:   FU with me as needed.   Signed,  Lorine BearsMuhammad Joncarlos Atkison, MD  05/10/2017 2:38 PM    [facility]

## 2017-05-10 NOTE — Patient Instructions (Addendum)
Medication Instructions:  Your physician recommends that you continue on your current medications as directed. Please refer to the Current Medication list given to you today.   Labwork: none  Testing/Procedures: Your physician has requested that you have an exercise tolerance test. For further information please visit [email or web address]. Please also follow instruction sheet, as given.    Follow-Up: Your physician recommends that you schedule a follow-up appointment as needed.    Any Other Special Instructions Will Be Listed Below (If Applicable).     If you need a refill on your cardiac medications before your next appointment, please call your pharmacy.   Exercise Stress Electrocardiogram An exercise stress electrocardiogram is a test to check how blood flows to your heart. It is done to find areas of poor blood flow. You will need to walk on a treadmill for this test. The electrocardiogram will record your heartbeat when you are at rest and when you are exercising. What happens before the procedure?  Do not have drinks with caffeine or foods with caffeine for 24 hours before the test, or as told by your doctor. This includes coffee, tea (even decaf tea), sodas, chocolate, and cocoa.  Follow your doctor's instructions about eating and drinking before the test.  Ask your doctor what medicines you should or should not take before the test. Take your medicines with water unless told by your doctor not to.  If you use an inhaler, bring it with you to the test.  Do not  smoke for 4 hours before the test.  Do not put lotions, powders, creams, or oils on your chest before the test.  Wear comfortable shoes and clothing. What happens during the procedure?  You will have patches put on your chest. Small areas of your chest may need to be shaved. Wires will be connected to the patches.  Your heart rate will be watched while you are resting and while you are exercising.  You will  walk on the treadmill. The treadmill will slowly get faster to raise your heart rate.  What happens after the procedure?  Your heart rate and blood pressure will be watched after the test.  You may return to your normal diet, activities, and medicines or as told by your doctor. This information is not intended to replace advice given to you by your health care provider. Make sure you discuss any questions you have with your health care provider. Document Released: 05/24/2008 Document Revised: 08/04/2016 Document Reviewed: 08/13/2013 Elsevier Interactive Patient Education  2017 [employer]

## 2017-05-11 ENCOUNTER — Telehealth: Payer: Self-pay | Admitting: Cardiovascular Disease

## 2017-05-11 ENCOUNTER — Ambulatory Visit
Admission: RE | Admit: 2017-05-11 | Discharge: 2017-05-11 | Disposition: A | Discharge status: Home / Self Care | Payer: Commercial Managed Care - PPO | Source: Ambulatory Visit | Attending: Nurse Practitioner | Admitting: Nurse Practitioner

## 2017-05-11 ENCOUNTER — Encounter: Payer: Self-pay | Admitting: Nurse Practitioner

## 2017-05-11 ENCOUNTER — Ambulatory Visit (INDEPENDENT_AMBULATORY_CARE_PROVIDER_SITE_OTHER): Payer: PRIVATE HEALTH INSURANCE | Admitting: Nurse Practitioner

## 2017-05-11 VITALS — BP 133/75 | HR 77 | Ht 69.0 in | Wt 247.4 lb

## 2017-05-11 DIAGNOSIS — R0789 Other chest pain: Secondary | ICD-10-CM

## 2017-05-11 DIAGNOSIS — Z87891 Personal history of nicotine dependence: Secondary | ICD-10-CM

## 2017-05-11 DIAGNOSIS — Z716 Tobacco abuse counseling: Secondary | ICD-10-CM | POA: Diagnosis not present

## 2017-05-11 DIAGNOSIS — Z720 Tobacco use: Secondary | ICD-10-CM

## 2017-05-11 MED ORDER — BUPROPION HCL ER (SR) 150 MG PO TB12
150.0000 mg | ORAL_TABLET | Freq: Two times a day (BID) | ORAL | 2 refills | Status: DC
Start: 2017-05-11 — End: 2017-11-07

## 2017-05-11 MED ORDER — CYCLOBENZAPRINE HCL 10 MG PO TABS: 10.0000 mg | ORAL_TABLET | Freq: Three times a day (TID) | ORAL | 0 refills | Status: DC | PRN

## 2017-05-11 NOTE — Patient Instructions (Addendum)
Brandon Mccoy , Thank you for coming in to clinic today.  1. For your smoking cessation, here is the plan: - Start Wellbutrin 150mg  tablets - take 1 tab daily for 3 days - Then, start taking 1 tab TWICE daily (about every 12 hours). Continue this for about 7 weeks. - To quit smoking - wait to start this treatment until you are mentally ready to quit. - Choose a quit date in the future. Start taking the medicine about 1-2 weeks away from your quit date. Reduce the number of cigarettes daily until you eventually QUIT completely on your quit date. Continue taking the Wellbutrin twice daily for total 7 weeks, we may continue for longer if you need it. - Can call 1-800-Quit Now for additional assistance.   2. For your chest pain: - we will do a chest x ray today. - While you are not working. Take Flexeril up to 3 times daily. - Start taking Tylenol extra strength 1 to 2 tablets every 6-8 hours for aches or fever/chills for next few days as needed.  Do not take more than 3,000 mg in 24 hours from all medicines.  May take Ibuprofen as well if tolerated 200-400mg  every 8 hours as needed. - Use heat and ice.  Apply this for 15 minutes at a time 6-8 times per day.   - Muscle rub with lidocaine.  Avoid using this with heat and ice to avoid burns. - Call me after your stress tess results and let me know about physical therapy.   Please schedule a follow-up appointment with Wilhelmina Mcardle, AGNP to Return if symptoms worsen or fail to improve.  If you have any other questions or concerns, please feel free to call the clinic or send a message through MyChart. You may also schedule an earlier appointment if necessary.  Wilhelmina Mcardle, DNP, AGNP-BC Adult Gerontology Nurse Practitioner [facility]    Coping with Quitting Smoking Quitting smoking is a physical and mental challenge. You will face cravings, withdrawal symptoms, and temptation. Before quitting, work with your health care  provider to make a plan that can help you cope. Preparation can help you quit and keep you from giving in. How can I cope with cravings? Cravings usually last for 5-10 minutes. If you get through it, the craving will pass. Consider taking the following actions to help you cope with cravings:  Keep your mouth busy:  Chew sugar-free gum.  Suck on hard candies or a straw.  Brush your teeth.  Keep your hands and body busy:  Immediately change to a different activity when you feel a craving.  Squeeze or play with a ball.  Do an activity or a hobby, like making bead jewelry, practicing needlepoint, or working with wood.  Mix up your normal routine.  Take a short exercise break. Go for a quick walk or run up and down stairs.  Spend time in public places where smoking is not allowed.  Focus on doing something kind or helpful for someone else.  Call a friend or family member to talk during a craving.  Join a support group.  Call a quit line, such as 1-800-QUIT-NOW.  Talk with your health care provider about medicines that might help you cope with cravings and make quitting easier for you. How can I deal with withdrawal symptoms? Your body may experience negative effects as it tries to get used to not having nicotine in the system. These effects are called withdrawal symptoms. They may  include:  Feeling hungrier than normal.  Trouble concentrating.  Irritability.  Trouble sleeping.  Feeling depressed.  Restlessness and agitation.  Craving a cigarette. 1.  To manage withdrawal symptoms:  Avoid places, people, and activities that trigger your cravings.  Remember why you want to quit.  Get plenty of sleep.  Avoid coffee and other caffeinated drinks. These may worsen some of your symptoms. How can I handle social situations? Social situations can be difficult when you are quitting smoking, especially in the first few weeks. To manage this, you can:  Avoid parties,  bars, and other social situations where people might be smoking.  Avoid alcohol.  Leave right away if you have the urge to smoke.  Explain to your family and friends that you are quitting smoking. Ask for understanding and support.  Plan activities with friends or family where smoking is not an option. What are some ways I can cope with stress? Wanting to smoke may cause stress, and stress can make you want to smoke. Find ways to manage your stress. Relaxation techniques can help. For example:  Breathe slowly and deeply, in through your nose and out through your mouth.  Listen to soothing, relaxing music.  Talk with a family member or friend about your stress.  Light a candle.  Soak in a bath or take a shower.  Think about a peaceful place. What are some ways I can prevent weight gain? Be aware that many people gain weight after they quit smoking. However, not everyone does. To keep from gaining weight, have a plan in place before you quit and stick to the plan after you quit. Your plan should include:  Having healthy snacks. When you have a craving, it may help to:  Eat plain popcorn, crunchy carrots, celery, or other cut vegetables.  Chew sugar-free gum.  Changing how you eat:  Eat small portion sizes at meals.  Eat 4-6 small meals throughout the day instead of 1-2 large meals a day.  Be mindful when you eat. Do not watch television or do other things that might distract you as you eat.  Exercising regularly:  Make time to exercise each day. If you do not have time for a long workout, do short bouts of exercise for 5-10 minutes several times a day.  Do some form of strengthening exercise, like weight lifting, and some form of aerobic exercise, like running or swimming.  Drinking plenty of water or other low-calorie or no-calorie drinks. Drink 6-8 glasses of water daily, or as much as instructed by your health care provider. Summary  Quitting smoking is a physical  and mental challenge. You will face cravings, withdrawal symptoms, and temptation to smoke again. Preparation can help you as you go through these challenges.  You can cope with cravings by keeping your mouth busy (such as by chewing gum), keeping your body and hands busy, and making calls to family, friends, or a helpline for people who want to quit smoking.  You can cope with withdrawal symptoms by avoiding places where people smoke, avoiding drinks with caffeine, and getting plenty of rest.  Ask your health care provider about the different ways to prevent weight gain, avoid stress, and handle social situations. This information is not intended to replace advice given to you by your health care provider. Make sure you discuss any questions you have with your health care provider. Document Released: 12/03/2016 Document Revised: 12/03/2016 Document Reviewed: 12/03/2016 Elsevier Interactive Patient Education  2017 [employer]

## 2017-05-11 NOTE — Progress Notes (Signed)
Subjective:    Patient ID: Brandon Mccoy, male    DOB: 1976-08-12, 41 y.o.   MRN: [id number]  Aric Jost is a 41 y.o. male presenting on 05/11/2017 for Chest Pain (pt would like short term disability forms fill out)   HPI  Ongoing atypical chest pain Saturday 10 days ago had pain had to lay on stomach and hang head off cough for relief.  Felt like breathing would be labored and feeling lightheaded.  He has continued to have sharp pain under left breast w/ and w/o physical activity that is relieved by changing position.  Currently in pain 6-7/10 and has relief when sitting fully upright and leaning back in chair. He has seen Cardiology and is set to have an exercise stress test on 05/12/17.  Has been taking ibuprofen 2-4 200 mg tabs daily.  Smoking Cessation Smokes 1/2 ppd and has smoked for 20 years.  He has cut back and is currently 1/4 ppd.  He has been able to cut back to only 1-2 cigs daily and is now increasing again. He wants to stop, but is losing confidence he can stop without medical assistance.  He has tried "the patches" when younger and had vivid dreams.  Would like to try wellbutrin.      Would like to quit by Wedding date 09/17/17 - Promised nephew and has motivation to meet nephew's desire for him to quit.     Obesity It has helped with being off the road and eating healthier at home with some weight loss.   Social History  Substance Use Topics  . Smoking status: Current Every Day Smoker    Packs/day: 0.50    Types: Cigarettes  . Smokeless tobacco: Never Used  . Alcohol use Yes     Comment: regularly    Review of Systems Per HPI unless specifically indicated above     Objective:    BP 133/75   Pulse 77   Ht 5\' 9"  (1.753 m)   Wt 247 lb 6.4 oz (112.2 kg)   BMI 36.53 kg/m    Wt Readings from Last 3 Encounters:  05/11/17 247 lb 6.4 oz (112.2 kg)  05/10/17 248 lb (112.5 kg)  04/26/17 250 lb (113.4 kg)    Physical Exam  Constitutional: He is oriented to  person, place, and time. He appears well-developed and well-nourished. He appears distressed.  Pt in visible pain w/ grimacing and shifting in chair  HENT:  Head: Normocephalic and atraumatic.  Eyes: Conjunctivae are normal. Pupils are equal, round, and reactive to light.  Neck: Normal range of motion. No JVD present. No tracheal deviation present.  Cardiovascular: Normal rate, regular rhythm and normal heart sounds.   Pulmonary/Chest: Effort normal and breath sounds normal. No respiratory distress.    Abdominal: Soft. Bowel sounds are normal. He exhibits no distension and no mass. There is no tenderness.  Lymphadenopathy:    He has no cervical adenopathy.  Neurological: He is alert and oriented to person, place, and time.  Skin: Skin is warm and dry.  Psychiatric: He has a normal mood and affect. Judgment and thought content normal.       Assessment & Plan:   Problem List Items Addressed This Visit    None    Visit Diagnoses    Encounter for smoking cessation counseling    -  Primary Pt has cut back cigarettes and worked to quit without medical therapy.  Now notices increased cravings and would like to start medication.  Plan: 1. Start bupropion 150 mg every 12 hours for 7 weeks.  Start day 1-3 with 1 tablet daily.  Increase to 1 tablet every 12 hours for the next 7 weeks. 2. Set quit date 7 days after starting medication.  Work toward quitting or at least reducing by half. 3. Follow up 6 weeks.   Relevant Medications   buPROPion (WELLBUTRIN SR) 150 MG 12 hr tablet  Discussion today >10 minutes specifically on counseling on risks of tobacco use, complications, treatment, smoking cessation.     Other chest pain     No changes in chest pain improved or worsened.  Initial cardiology workup normal.  Exercise stress test scheduled 05/12/17.  Plan: 1. Proceed with treatment of musculoskeletal pain. - Treat with NSAIDs (acetaminophen and ibuprofen).  Discussed alternate dosing and  max dosing. - Apply heat and/or ice to affected area. -  May also apply a muscle rub with lidocaine after heat or ice. - Take cyclobenzaprine10 mg up to 3 times daily as needed for spasm.  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  Do not drive until knows how drowsy it will make him. - Proceed with PT if exercise stress test normal. - Chest Xray for r/o bony abnormalities or pulmonary nodules that may indicate cancer w/ hx of smoking.    Relevant Medications   cyclobenzaprine (FLEXERIL) 10 MG tablet   Other Relevant Orders   DG Chest 2 View (Completed)   Ambulatory referral to Physical Therapy      Meds ordered this encounter  Medications  . buPROPion (WELLBUTRIN SR) 150 MG 12 hr tablet    Sig: Take 1 tablet (150 mg total) by mouth 2 (two) times daily. Day 1-3: take one tablet daily.  Day 4: take 1 tablet twice per day.    Dispense:  60 tablet    Refill:  2    Order Specific Question:   Supervising Provider    Answer:   Smitty CordsKARAMALEGOS, ALEXANDER J [[id number]]  . cyclobenzaprine (FLEXERIL) 10 MG tablet    Sig: Take 1 tablet (10 mg total) by mouth 3 (three) times daily as needed for muscle spasms.    Dispense:  30 tablet    Refill:  0    Order Specific Question:   Supervising Provider    Answer:   Smitty CordsKARAMALEGOS, ALEXANDER J [[id number]]      Follow up plan: Return in about 6 weeks (around 06/22/2017), or if symptoms worsen or fail to improve, for smoking cessation.   Wilhelmina McardleLauren Debar Plate, DNP, AGPCNP-BC Adult Gerontology Primary Care Nurse Practitioner [facility] 05/11/2017, 9:43 AM

## 2017-05-11 NOTE — Telephone Encounter (Signed)
Reviewed GXT instructions w/pt who verbalized understanding. Confirmed appt time. Pt verbalized understanding and agreeable w/plan.

## 2017-05-12 ENCOUNTER — Telehealth: Payer: Self-pay

## 2017-05-12 ENCOUNTER — Ambulatory Visit (INDEPENDENT_AMBULATORY_CARE_PROVIDER_SITE_OTHER): Payer: Commercial Managed Care - PPO

## 2017-05-12 DIAGNOSIS — R079 Chest pain, unspecified: Secondary | ICD-10-CM | POA: Diagnosis not present

## 2017-05-12 LAB — EXERCISE TOLERANCE TEST
CHL CUP MPHR: 179 {beats}/min
CSEPED: 6 min
CSEPEDS: 55 s
CSEPEW: 8.3 METS
Peak HR: 166 {beats}/min
Percent HR: 92 %
Rest HR: 101 {beats}/min

## 2017-05-12 NOTE — Telephone Encounter (Signed)
I notified the pt of his test results. He wanted to know if he was release to go back to work since his cardiac work is negative? I notified him that you recommend PT, but I would have to f/u with you to find out if he could do PT in conjunction with working. Please advise

## 2017-05-12 NOTE — Progress Notes (Signed)
I have reviewed this encounter including the documentation in this note and/or discussed this patient with the provider, Wilhelmina McardleLauren Kennedy, AGPCNP-BC. I am certifying that I agree with the content of this note as supervising physician.  Saralyn PilarAlexander Micha Dosanjh, DO [facility] 05/12/2017, 10:23 PM

## 2017-05-12 NOTE — Telephone Encounter (Signed)
I called the pt back after talking to Lauren and informed him that she stated she would like him to be seen by PT for evaluation/treat before she release him to return to work. The pt verbalize understanding.

## 2017-05-12 NOTE — Telephone Encounter (Signed)
-----   Message from Galen ManilaLauren Renee Kennedy, NP sent at 05/12/2017  2:20 PM EDT ----- Please call pt to inform of our next step for PT.  We briefly discussed this option yesterday when he was in clinic.  His cardiac workup is negative, so we need to work to optimize his musculoskeletal function and get him back to work.

## 2017-05-13 ENCOUNTER — Telehealth: Payer: Self-pay

## 2017-05-13 NOTE — Telephone Encounter (Signed)
-----   Message from Lauren Renee Kennedy, NP sent at 05/12/2017  2:20 PM EDT ----- Please call pt to inform of our next step for PT.  We briefly discussed this option yesterday when he was in clinic.  His cardiac workup is negative, so we need to work to optimize his musculoskeletal function and get him back to work. 

## 2017-05-13 NOTE — Telephone Encounter (Signed)
Patient advised as below. Patient verbalizes understanding and is in agreement with treatment plan.  

## 2017-05-17 ENCOUNTER — Telehealth: Payer: Self-pay | Admitting: Nurse Practitioner

## 2017-05-17 NOTE — Telephone Encounter (Signed)
Pt. Called states that the Flexeril was not helping. Pt call back # [phone]

## 2017-05-18 NOTE — Telephone Encounter (Signed)
Has taken it flexeril 2-3 times per day and not noticing a difference.  Recommend STOP taking flexeril.   Has not gone to PT scheduled on 05/25/17.  Ibuprofen helps a little.  Pain was at its worst 1 month ago and is improving most days.  Couldn't sit and drive during a shorter trip about 4 days ago.  Returning to work still likely difficult.  Would like to explore GI possibilities if not relieved with PT.  He states Dr. Elease HashimotoNahser suggested possible GI cause with some chest pain about 2 years ago.

## 2017-05-25 ENCOUNTER — Other Ambulatory Visit: Payer: Self-pay | Admitting: Nurse Practitioner

## 2017-05-25 ENCOUNTER — Ambulatory Visit: Payer: Commercial Managed Care - PPO | Attending: Nurse Practitioner | Admitting: Physical Therapy

## 2017-05-25 DIAGNOSIS — R1012 Left upper quadrant pain: Secondary | ICD-10-CM

## 2017-05-25 DIAGNOSIS — R079 Chest pain, unspecified: Secondary | ICD-10-CM

## 2017-05-25 DIAGNOSIS — R0789 Other chest pain: Secondary | ICD-10-CM | POA: Insufficient documentation

## 2017-05-25 MED ORDER — OMEPRAZOLE 20 MG PO CPDR: 20.0000 mg | DELAYED_RELEASE_CAPSULE | Freq: Every day | ORAL | 3 refills | Status: DC

## 2017-05-25 NOTE — Patient Instructions (Signed)
BP - 143/74  HR - 92 bpm
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
## 2017-05-25 NOTE — Progress Notes (Signed)
Discussed case w/ PT after eval w/ no reproducible musculoskeletal pain.   Cardiac workup negative.  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  Previous workup has excluded these causes and patient has had persistent pain minimally improved with torso extension.  Previous episode of similar pain that was self-limited about 2 years ago.  Consideration of diagnosis of PUD vs pancreatitis or other GI cause at this time.  Since being out of work, he has made diet changes and has lost weight (at least 10 lbs) without significant symptom relief.    Plan: 1. Initiate PPI treatment w/ omeprazole  20 mg daily 2. Lab studies for evaluation of pancreatitis 3. Pt declines abdominal imaging w/ CT today, but will consider in future if needed.  Called pt to discuss plan.  Pt agrees and [facility] am for labs.

## 2017-05-26 ENCOUNTER — Telehealth: Payer: Self-pay | Admitting: Nurse Practitioner

## 2017-05-26 ENCOUNTER — Other Ambulatory Visit: Payer: Self-pay

## 2017-05-26 DIAGNOSIS — R1012 Left upper quadrant pain: Secondary | ICD-10-CM

## 2017-05-26 LAB — CBC WITH DIFFERENTIAL/PLATELET
Basophils Absolute: 89 cells/uL (ref 0–200)
Basophils Relative: 1 %
Eosinophils Absolute: 267 cells/uL (ref 15–500)
Eosinophils Relative: 3 %
HCT: 48.7 % (ref 38.5–50.0)
Hemoglobin: 17 g/dL (ref 13.2–17.1)
Lymphocytes Relative: 27 %
Lymphs Abs: 2403 cells/uL (ref 850–3900)
MCH: 32 pg (ref 27.0–33.0)
MCHC: 34.9 g/dL (ref 32.0–36.0)
MCV: 91.5 fL (ref 80.0–100.0)
MPV: 10.2 fL (ref 7.5–12.5)
Monocytes Absolute: 801 cells/uL (ref 200–950)
Monocytes Relative: 9 %
Neutro Abs: 5340 cells/uL (ref 1500–7800)
Neutrophils Relative %: 60 %
Platelets: 250 10*3/uL (ref 140–400)
RBC: 5.32 MIL/uL (ref 4.20–5.80)
RDW: 13 % (ref 11.0–15.0)
WBC: 8.9 10*3/uL (ref 3.8–10.8)

## 2017-05-26 NOTE — Telephone Encounter (Signed)
Pt would like to return to work on June 18th.  Please fax to [phone]

## 2017-05-27 LAB — COMPREHENSIVE METABOLIC PANEL
ALT: 70 U/L — ABNORMAL HIGH (ref 9–46)
AST: 56 U/L — ABNORMAL HIGH (ref 10–40)
Albumin: 4.3 g/dL (ref 3.6–5.1)
Alkaline Phosphatase: 83 U/L (ref 40–115)
BUN: 12 mg/dL (ref 7–25)
CO2: 22 mmol/L (ref 20–31)
Calcium: 9 mg/dL (ref 8.6–10.3)
Chloride: 105 mmol/L (ref 98–110)
Creat: 0.9 mg/dL (ref 0.60–1.35)
Glucose, Bld: 112 mg/dL — ABNORMAL HIGH (ref 65–99)
Potassium: 3.9 mmol/L (ref 3.5–5.3)
Sodium: 138 mmol/L (ref 135–146)
Total Bilirubin: 0.6 mg/dL (ref 0.2–1.2)
Total Protein: 7.3 g/dL (ref 6.1–8.1)

## 2017-05-27 LAB — H. PYLORI BREATH TEST: H. pylori Breath Test: NOT DETECTED

## 2017-05-27 LAB — LIPASE: Lipase: 12 U/L (ref 7–60)

## 2017-05-27 LAB — AMYLASE: Amylase: 19 U/L — ABNORMAL LOW (ref 21–101)

## 2017-05-30 ENCOUNTER — Ambulatory Visit: Payer: Commercial Managed Care - PPO | Admitting: Physical Therapy

## 2017-06-02 ENCOUNTER — Encounter: Payer: Commercial Managed Care - PPO | Admitting: Physical Therapy

## 2017-06-03 ENCOUNTER — Telehealth: Payer: Self-pay | Admitting: Cardiovascular Disease

## 2017-06-03 NOTE — Telephone Encounter (Signed)
Received records request [employer], forwarded to [employer] for processing. ° °

## 2017-07-07 NOTE — Telephone Encounter (Signed)
Placed [employer] forms in nurse box for MD to fill out

## 2017-07-13 ENCOUNTER — Ambulatory Visit: Payer: Commercial Managed Care - PPO | Admitting: Gastroenterology

## 2017-08-24 ENCOUNTER — Other Ambulatory Visit: Payer: Self-pay

## 2017-08-24 ENCOUNTER — Encounter: Payer: Self-pay | Admitting: Gastroenterology

## 2017-08-24 ENCOUNTER — Telehealth: Payer: Self-pay

## 2017-08-24 ENCOUNTER — Ambulatory Visit (INDEPENDENT_AMBULATORY_CARE_PROVIDER_SITE_OTHER): Payer: No Typology Code available for payment source | Admitting: Gastroenterology

## 2017-08-24 VITALS — BP 147/80 | HR 93 | Temp 98.1°F | Ht 69.0 in | Wt 246.4 lb

## 2017-08-24 DIAGNOSIS — K625 Hemorrhage of anus and rectum: Secondary | ICD-10-CM

## 2017-08-24 DIAGNOSIS — R1012 Left upper quadrant pain: Secondary | ICD-10-CM

## 2017-08-24 DIAGNOSIS — R945 Abnormal results of liver function studies: Secondary | ICD-10-CM

## 2017-08-24 DIAGNOSIS — Z114 Encounter for screening for human immunodeficiency virus [HIV]: Secondary | ICD-10-CM

## 2017-08-24 DIAGNOSIS — R7989 Other specified abnormal findings of blood chemistry: Secondary | ICD-10-CM

## 2017-08-24 MED ORDER — OMEPRAZOLE 40 MG PO CPDR: 40.0000 mg | DELAYED_RELEASE_CAPSULE | Freq: Every day | ORAL | 3 refills | Status: DC

## 2017-08-24 MED ORDER — DICYCLOMINE HCL 10 MG PO CAPS: 10.0000 mg | ORAL_CAPSULE | Freq: Three times a day (TID) | ORAL | 0 refills | Status: DC

## 2017-08-24 NOTE — Progress Notes (Signed)
Brandon MoodKiran Hinton Luellen MD, MRCP(U.K) [address]  [address]  [place], [place] [address]  Main: [phone]  Fax: [phone]   Gastroenterology Consultation  Referring Provider:     Galen ManilaKennedy, Lauren Renee, * Primary Care Physician:  Galen ManilaKennedy, Lauren Renee, NP Primary Gastroenterologist:  Dr. Wyline MoodKiran Ramiz Mccoy  Reason for Consultation:     Abdominal pain         HPI:   Brandon ShowsJames Mccoy is a 41 y.o. y/o male referred for consultation & management  by Dr. Kyung RuddKennedy, Alison StallingLauren Renee, NP.    He has been referred for abdominal pain.  Abdominal pain: Onset: 2 years - getting gradually worse. Drives a truck for living , in 03/2017 thought he was havng a heart attack , pulled over , when he lay down, got better. Has had several tests and was told his heart was normal , was also told it was musculoskeletal and saw another doctor for the same and was told it was not related to his muscles.  Site :over his ribs, on and off, once every few days , sometimes every day , depending on what he eats. Each episode of pain can last from hours to days  Radiation: non radiating  Severity :some days mild and some days better when he raises arm  Nature of pain: some times sharp stabbing in nature  Aggravating factors: when he drives his truck feels the pain is worse , hot dogs make it worse , can occur 30 mins to an hour after eating one  Relieving factors :strecthing back  Weight loss: up and down with weight  NSAID use: Ibuprofen for headaches 1-2 times a week  PPI use :Prilsoec as needed  Gall bladder surgery: intact  Frequency of bowel movements: once a day , 2 months saw blood in his stools, mixed with his stool  Change in bowel movements: no  Relief with bowel movements: yes  Gas/Bloating/Abdominal distension: gas    H pylori breath test 05/2017 - negative CBC,lipase -normal ,AST 56,ALT 70     Past Medical History:  Diagnosis Date  . Allergy   . Hypertension     Past Surgical History:  Procedure Laterality  Date  . ANKLE SURGERY    . HAND SURGERY    . HERNIA REPAIR      Prior to Admission medications   Medication Sig Start Date End Date Taking? Authorizing Provider  amLODipine (NORVASC) 10 MG tablet Take 1 tablet (10 mg total) by mouth daily. 04/04/17  Yes Karamalegos, Netta NeatAlexander J, DO  Blood Pressure Monitoring (BLOOD PRESSURE CUFF) MISC 1 each by Does not apply route daily. 06/10/16  Yes Krebs, Amy Lauren, NP  buPROPion (WELLBUTRIN SR) 150 MG 12 hr tablet Take 1 tablet (150 mg total) by mouth 2 (two) times daily. Day 1-3: take one tablet daily.  Day 4: take 1 tablet twice per day. Patient not taking: Reported on 08/24/2017 05/11/17   Galen ManilaKennedy, Lauren Renee, NP  ibuprofen (ADVIL,MOTRIN) 800 MG tablet TK 1 T PO TID PRN P AND SWELLING 10/06/16   [provider]  omeprazole (PRILOSEC) 20 MG capsule Take 1 capsule (20 mg total) by mouth daily. Patient not taking: Reported on 08/24/2017 05/25/17   Galen ManilaKennedy, Lauren Renee, NP    Family History  Problem Relation Age of Onset  . Heart disease Father        stent placement   . CVA Father   . Diabetes Father   . Heart attack Father [id number]  . Diabetes Mother  Social History  Substance Use Topics  . Smoking status: Current Every Day Smoker    Packs/day: 0.50    Types: Cigarettes  . Smokeless tobacco: Never Used  . Alcohol use Yes     Comment: regularly    Allergies as of 08/24/2017 - Review Complete 08/24/2017  Allergen Reaction Noted  . Penicillins Other (See Comments) 04/14/2013    Review of Systems:    All systems reviewed and negative except where noted in HPI.   Physical Exam:  BP (!) 147/80 (BP Location: Right Arm, Patient Position: Sitting, Cuff Size: Large)   Pulse 93   Temp 98.1 F (36.7 C) (Oral)   Ht 5\' 9"  (1.753 m)   Wt 246 lb 6.4 oz (111.8 kg)   BMI 36.39 kg/m  No LMP for male patient. Psych:  Alert and cooperative. Normal mood and affect. General:   Alert,  Well-developed, well-nourished, pleasant and cooperative  in NAD Head:  Normocephalic and atraumatic. Eyes:  Sclera clear, no icterus.   Conjunctiva pink. Ears:  Normal auditory acuity. Nose:  No deformity, discharge, or lesions. Mouth:  No deformity or lesions,oropharynx pink & moist. Neck:  Supple; no masses or thyromegaly. Lungs:  Respirations even and unlabored.  Clear throughout to auscultation.   No wheezes, crackles, or rhonchi. No acute distress. Heart:  Regular rate and rhythm; no murmurs, clicks, rubs, or gallops. Abdomen:  Normal bowel sounds.  No bruits.  Soft, non-tender and non-distended without masses, hepatosplenomegaly or hernias noted.  No guarding or rebound tenderness.    Neurologic:  Alert and oriented x3;  grossly normal neurologically. Skin:  Intact without significant lesions or rashes. No jaundice. Lymph Nodes:  No significant cervical adenopathy. Psych:  Alert and cooperative. Normal mood and affect.  Imaging Studies: No results found.  Assessment and Plan:   Brandon Mccoy is a 41 y.o. y/o male has been referred for LUQ pain, non specific in nature. 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 Commence on Prilosec 40 mg Q daily for empiric treatment for dyspepsia, PRN Bentyl 2. EGD for dyspepsia 3. Colonoscopy for rectal bleeding  4. RUQ USG for abnormal LFT's and viral hepatitis, autoimmune panel  5. If above tests are negative then may need a CT scan of the abdomen  I have discussed alternative options, risks & benefits,  which include, but are not limited to, bleeding, infection, perforation,respiratory complication & drug reaction.  The patient agrees with this plan & written consent will be obtained.     Follow up in 6-8 weeks   Dr Brandon Mood MD,MRCP(U.K)

## 2017-08-24 NOTE — Addendum Note (Signed)
Addended by: Ardyth ManARTER, Torie Towle Z on: 08/24/2017 03:52 PM   Modules accepted: Orders, SmartSet

## 2017-08-24 NOTE — Telephone Encounter (Signed)
Gastroenterology Pre-Procedure Review  Request Date: 9/17 Requesting Physician: Dr. Tobi BastosAnna  PATIENT REVIEW QUESTIONS: The patient responded to the following health history questions as indicated:    1. Are you having any GI issues? yes (rectal bleeding) 2. Do you have a personal history of Polyps? no 3. Do you have a family history of Colon Cancer or Polyps? yes (grandfather: rectal cancer) 4. Diabetes Mellitus? no 5. Joint replacements in the past 12 months?no 6. Major health problems in the past 3 months?no 7. Any artificial heart valves, MVP, or defibrillator?no    MEDICATIONS & ALLERGIES:    Patient reports the following regarding taking any anticoagulation/antiplatelet therapy:   Plavix, Coumadin, Eliquis, Xarelto, Lovenox, Pradaxa, Brilinta, or Effient? no Aspirin? no  Patient confirms/reports the following medications:  Current Outpatient Prescriptions  Medication Sig Dispense Refill  . amLODipine (NORVASC) 10 MG tablet Take 1 tablet (10 mg total) by mouth daily. 90 tablet 3  . Blood Pressure Monitoring (BLOOD PRESSURE CUFF) MISC 1 each by Does not apply route daily. 1 each 0  . buPROPion (WELLBUTRIN SR) 150 MG 12 hr tablet Take 1 tablet (150 mg total) by mouth 2 (two) times daily. Day 1-3: take one tablet daily.  Day 4: take 1 tablet twice per day. (Patient not taking: Reported on 08/24/2017) 60 tablet 2  . dicyclomine (BENTYL) 10 MG capsule Take 1 capsule (10 mg total) by mouth 4 (four) times daily -  before meals and at bedtime. 90 capsule 0  . ibuprofen (ADVIL,MOTRIN) 800 MG tablet TK 1 T PO TID PRN P AND SWELLING  0  . omeprazole (PRILOSEC) 40 MG capsule Take 1 capsule (40 mg total) by mouth daily. 90 capsule 3   No current facility-administered medications for this visit.     Patient confirms/reports the following allergies:  Allergies  Allergen Reactions  . Penicillins Other (See Comments)    Childhood allergy unknown    No orders of the defined types were placed in  this encounter.   AUTHORIZATION INFORMATION Primary Insurance: 1D#: Group #:  Secondary Insurance: 1D#: Group #:  SCHEDULE INFORMATION: Date: 9/17 Time: Location: [facility]

## 2017-08-25 ENCOUNTER — Other Ambulatory Visit
Admission: RE | Admit: 2017-08-25 | Discharge: 2017-08-25 | Disposition: A | Discharge status: Home / Self Care | Payer: Commercial Managed Care - PPO | Source: Ambulatory Visit | Attending: Gastroenterology | Admitting: Gastroenterology

## 2017-08-25 DIAGNOSIS — R1012 Left upper quadrant pain: Secondary | ICD-10-CM

## 2017-08-25 DIAGNOSIS — R945 Abnormal results of liver function studies: Secondary | ICD-10-CM | POA: Diagnosis present

## 2017-08-25 DIAGNOSIS — K625 Hemorrhage of anus and rectum: Secondary | ICD-10-CM | POA: Diagnosis present

## 2017-08-25 DIAGNOSIS — R7989 Other specified abnormal findings of blood chemistry: Secondary | ICD-10-CM

## 2017-08-25 DIAGNOSIS — Z114 Encounter for screening for human immunodeficiency virus [HIV]: Secondary | ICD-10-CM

## 2017-08-25 LAB — COMPREHENSIVE METABOLIC PANEL
ALK PHOS: 91 U/L (ref 38–126)
ALT: 53 U/L (ref 17–63)
ANION GAP: 8 (ref 5–15)
AST: 37 U/L (ref 15–41)
Albumin: 4.6 g/dL (ref 3.5–5.0)
BILIRUBIN TOTAL: 0.8 mg/dL (ref 0.3–1.2)
BUN: 11 mg/dL (ref 6–20)
CALCIUM: 9.1 mg/dL (ref 8.9–10.3)
CO2: 26 mmol/L (ref 22–32)
CREATININE: 1.03 mg/dL (ref 0.61–1.24)
Chloride: 105 mmol/L (ref 101–111)
GFR calc non Af Amer: >60 mL/min (ref >60)
Glucose, Bld: 136 mg/dL — ABNORMAL HIGH (ref 65–99)
Potassium: 3.8 mmol/L (ref 3.5–5.1)
Sodium: 139 mmol/L (ref 135–145)
TOTAL PROTEIN: 8.2 g/dL — AB (ref 6.5–8.1)

## 2017-08-25 LAB — CBC
HEMATOCRIT: 46.8 % (ref 40.0–52.0)
HEMOGLOBIN: 16.2 g/dL (ref 13.0–18.0)
MCH: 30.9 pg (ref 26.0–34.0)
MCHC: 34.5 g/dL (ref 32.0–36.0)
MCV: 89.4 fL (ref 80.0–100.0)
Platelets: 239 10*3/uL (ref 150–440)
RBC: 5.23 MIL/uL (ref 4.40–5.90)
RDW: 13.1 % (ref 11.5–14.5)
WBC: 9.5 10*3/uL (ref 3.8–10.6)

## 2017-08-25 LAB — IRON AND TIBC
IRON: 136 ug/dL (ref 45–182)
Saturation Ratios: 32 % (ref 17.9–39.5)
TIBC: 427 ug/dL (ref 250–450)
UIBC: 291 ug/dL

## 2017-08-25 LAB — FERRITIN: Ferritin: 79 ng/mL (ref 24–336)

## 2017-08-26 LAB — ALPHA-1-ANTITRYPSIN: A-1 Antitrypsin, Ser: 100 mg/dL (ref 90–200)

## 2017-08-26 LAB — HEPATITIS A ANTIBODY, TOTAL: Hep A Total Ab: NEGATIVE

## 2017-08-26 LAB — HEPATITIS C ANTIBODY

## 2017-08-26 LAB — HEPATITIS B SURFACE ANTIGEN: Hepatitis B Surface Ag: NEGATIVE

## 2017-08-26 LAB — ANTI-SMOOTH MUSCLE ANTIBODY, IGG: F-Actin IgG: 10 U (ref 0–19)

## 2017-08-26 LAB — HEPATITIS B E ANTIBODY: HEP B E AB: NEGATIVE

## 2017-08-26 LAB — HEPATITIS B E ANTIGEN: Hep B E Ag: NEGATIVE

## 2017-08-26 LAB — HIV ANTIBODY (ROUTINE TESTING W REFLEX): HIV Screen 4th Generation wRfx: NONREACTIVE

## 2017-08-26 LAB — ANTINUCLEAR ANTIBODIES, IFA: ANA Ab, IFA: NEGATIVE

## 2017-08-26 LAB — HEPATITIS B SURFACE ANTIBODY,QUALITATIVE: Hep B S Ab: NONREACTIVE

## 2017-08-26 LAB — CERULOPLASMIN: Ceruloplasmin: 27.2 mg/dL (ref 16.0–31.0)

## 2017-08-27 LAB — IMMUNOGLOBULINS A/E/G/M, SERUM
IGA: 326 mg/dL (ref 90–386)
IGM (IMMUNOGLOBULIN M), SRM: 64 mg/dL (ref 20–172)
IgG (Immunoglobin G), Serum: 961 mg/dL (ref 700–1600)

## 2017-08-29 ENCOUNTER — Telehealth: Payer: Self-pay

## 2017-08-29 NOTE — Telephone Encounter (Signed)
-----   Message from Wyline MoodKiran Anna, MD sent at 08/28/2017  9:01 PM EDT ----- All results so far normal- needs hepa/b vaccine, glucose elevated - to discuss with PCP

## 2017-08-29 NOTE — Telephone Encounter (Signed)
Advised patient of results per Dr. Tobi BastosAnna.   All results so far normal- needs hepa/b vaccine, glucose elevated - to discuss with PCP

## 2017-08-30 LAB — ANTI-MICROSOMAL ANTIBODY LIVER / KIDNEY: LKM1 AB: 1.9 U (ref 0.0–20.0)

## 2017-08-31 ENCOUNTER — Other Ambulatory Visit: Payer: Self-pay

## 2017-08-31 ENCOUNTER — Telehealth: Payer: Self-pay | Admitting: Gastroenterology

## 2017-08-31 DIAGNOSIS — R1012 Left upper quadrant pain: Secondary | ICD-10-CM

## 2017-08-31 MED ORDER — OMEPRAZOLE 40 MG PO CPDR: 40.0000 mg | DELAYED_RELEASE_CAPSULE | Freq: Every day | ORAL | 3 refills | Status: DC

## 2017-08-31 MED ORDER — DICYCLOMINE HCL 10 MG PO CAPS: 10.0000 mg | ORAL_CAPSULE | Freq: Three times a day (TID) | ORAL | 0 refills | Status: DC

## 2017-08-31 NOTE — Telephone Encounter (Signed)
Patient called and stated that he needs his rx called into [facility] on [address] in [place]. If you sent something to [facility] can you please fix it?

## 2017-09-07 ENCOUNTER — Ambulatory Visit: Payer: No Typology Code available for payment source

## 2017-09-08 ENCOUNTER — Encounter: Payer: Self-pay | Admitting: *Deleted

## 2017-09-08 ENCOUNTER — Ambulatory Visit: Payer: No Typology Code available for payment source | Admitting: Anesthesiology

## 2017-09-08 ENCOUNTER — Ambulatory Visit
Admission: RE | Admit: 2017-09-08 | Discharge: 2017-09-08 | Disposition: A | Discharge status: Home / Self Care | Payer: No Typology Code available for payment source | Source: Ambulatory Visit | Attending: Gastroenterology | Admitting: Gastroenterology

## 2017-09-08 ENCOUNTER — Encounter
Admission: RE | Disposition: A | Discharge status: Home / Self Care | Payer: Self-pay | Source: Ambulatory Visit | Attending: Gastroenterology

## 2017-09-08 DIAGNOSIS — I10 Essential (primary) hypertension: Secondary | ICD-10-CM | POA: Diagnosis not present

## 2017-09-08 DIAGNOSIS — Z8249 Family history of ischemic heart disease and other diseases of the circulatory system: Secondary | ICD-10-CM | POA: Insufficient documentation

## 2017-09-08 DIAGNOSIS — K3189 Other diseases of stomach and duodenum: Secondary | ICD-10-CM | POA: Diagnosis not present

## 2017-09-08 DIAGNOSIS — R1012 Left upper quadrant pain: Secondary | ICD-10-CM | POA: Diagnosis not present

## 2017-09-08 DIAGNOSIS — F17210 Nicotine dependence, cigarettes, uncomplicated: Secondary | ICD-10-CM | POA: Insufficient documentation

## 2017-09-08 DIAGNOSIS — K2970 Gastritis, unspecified, without bleeding: Secondary | ICD-10-CM

## 2017-09-08 DIAGNOSIS — K209 Esophagitis, unspecified: Secondary | ICD-10-CM | POA: Diagnosis not present

## 2017-09-08 DIAGNOSIS — Z823 Family history of stroke: Secondary | ICD-10-CM | POA: Diagnosis not present

## 2017-09-08 DIAGNOSIS — Z880 Allergy status to penicillin: Secondary | ICD-10-CM | POA: Diagnosis not present

## 2017-09-08 DIAGNOSIS — E669 Obesity, unspecified: Secondary | ICD-10-CM | POA: Diagnosis not present

## 2017-09-08 DIAGNOSIS — K640 First degree hemorrhoids: Secondary | ICD-10-CM | POA: Insufficient documentation

## 2017-09-08 DIAGNOSIS — Z79899 Other long term (current) drug therapy: Secondary | ICD-10-CM | POA: Diagnosis not present

## 2017-09-08 DIAGNOSIS — K625 Hemorrhage of anus and rectum: Secondary | ICD-10-CM | POA: Insufficient documentation

## 2017-09-08 DIAGNOSIS — Z6836 Body mass index (BMI) 36.0-36.9, adult: Secondary | ICD-10-CM | POA: Diagnosis not present

## 2017-09-08 DIAGNOSIS — R945 Abnormal results of liver function studies: Secondary | ICD-10-CM

## 2017-09-08 DIAGNOSIS — R7989 Other specified abnormal findings of blood chemistry: Secondary | ICD-10-CM

## 2017-09-08 HISTORY — PX: COLONOSCOPY WITH PROPOFOL: SHX5780

## 2017-09-08 HISTORY — PX: ESOPHAGOGASTRODUODENOSCOPY (EGD) WITH PROPOFOL: SHX5813

## 2017-09-08 SURGERY — ESOPHAGOGASTRODUODENOSCOPY (EGD) WITH PROPOFOL
Anesthesia: General

## 2017-09-08 MED ORDER — FENTANYL CITRATE (PF) 100 MCG/2ML IJ SOLN
INTRAMUSCULAR | Status: DC | PRN
  Administered 2017-09-08 (×2): 50 ug via INTRAVENOUS

## 2017-09-08 MED ORDER — MIDAZOLAM HCL 2 MG/2ML IJ SOLN
INTRAMUSCULAR | Status: AC
  Filled 2017-09-08: qty 2

## 2017-09-08 MED ORDER — PROPOFOL 500 MG/50ML IV EMUL
INTRAVENOUS | Status: DC | PRN
  Administered 2017-09-08: 120 ug/kg/min via INTRAVENOUS

## 2017-09-08 MED ORDER — PROPOFOL 500 MG/50ML IV EMUL
INTRAVENOUS | Status: AC
  Filled 2017-09-08: qty 50

## 2017-09-08 MED ORDER — MIDAZOLAM HCL 2 MG/2ML IJ SOLN
INTRAMUSCULAR | Status: DC | PRN
  Administered 2017-09-08: 2 mg via INTRAVENOUS

## 2017-09-08 MED ORDER — LIDOCAINE HCL (CARDIAC) 20 MG/ML IV SOLN
INTRAVENOUS | Status: DC | PRN
  Administered 2017-09-08: 30 mg via INTRAVENOUS

## 2017-09-08 MED ORDER — GLYCOPYRROLATE 0.2 MG/ML IJ SOLN
INTRAMUSCULAR | Status: AC
  Filled 2017-09-08: qty 1

## 2017-09-08 MED ORDER — SODIUM CHLORIDE 0.9 % IV SOLN
INTRAVENOUS | Status: DC
  Administered 2017-09-08: 11:00:00 via INTRAVENOUS

## 2017-09-08 MED ORDER — LIDOCAINE HCL (PF) 2 % IJ SOLN
INTRAMUSCULAR | Status: AC
  Filled 2017-09-08: qty 2

## 2017-09-08 MED ORDER — GLYCOPYRROLATE 0.2 MG/ML IJ SOLN
INTRAMUSCULAR | Status: DC | PRN
  Administered 2017-09-08: 0.1 mg via INTRAVENOUS

## 2017-09-08 MED ORDER — FENTANYL CITRATE (PF) 100 MCG/2ML IJ SOLN
INTRAMUSCULAR | Status: AC
  Filled 2017-09-08: qty 2

## 2017-09-08 NOTE — Anesthesia Post-op Follow-up Note (Signed)
Anesthesia QCDR form completed.        

## 2017-09-08 NOTE — Op Note (Signed)
[facility] Gastroenterology Patient Name: Brandon Mccoy Procedure Date: 09/08/2017 10:24 AM MRN: [id number] Account #: [id number] Date of Birth: 15-May-1976 Admit Type: Outpatient Age: 41 Room: [facility] ENDO ROOM 3 Gender: Male Note Status: Finalized Procedure:            Colonoscopy Indications:          Rectal bleeding Providers:            Wyline Mood MD, MD Referring MD:         No Local Md, MD (Referring MD) Medicines:            Monitored Anesthesia Care Complications:        No immediate complications. Procedure:            Pre-Anesthesia Assessment:                       - Prior to the procedure, a History and Physical was                        performed, and patient medications, allergies and                        sensitivities were reviewed. The patient's tolerance of                        previous anesthesia was reviewed.                       - The risks and benefits of the procedure and the                        sedation options and risks were discussed with the                        patient. All questions were answered and informed                        consent was obtained.                       - ASA Grade Assessment: III - A patient with severe                        systemic disease.                       - After reviewing the risks and benefits, the patient                        was deemed in satisfactory condition to undergo the                        procedure.                       - ASA Grade Assessment: III - A patient with severe                        systemic disease.                       After obtaining informed consent, the colonoscope  was                        passed under direct vision. Throughout the procedure,                        the patient's blood pressure, pulse, and oxygen                        saturations were monitored continuously. The                        Colonoscope was introduced through the anus and                advanced to the the cecum, identified by the                        appendiceal orifice, IC valve and transillumination.                        The quality of the bowel preparation was good. The                        colonoscopy was performed with ease. The patient                        tolerated the procedure well. Findings:      The perianal and digital rectal examinations were normal.      Non-bleeding internal hemorrhoids were found during retroflexion. The       hemorrhoids were small and Grade I (internal hemorrhoids that do not       prolapse).      The exam was otherwise without abnormality on direct and retroflexion       views. Impression:           - Non-bleeding internal hemorrhoids.                       - The examination was otherwise normal on direct and                        retroflexion views.                       - No specimens collected. Recommendation:       - Discharge patient to home (with escort).                       - Resume previous diet.                       - Continue present medications.                       - Return to my office in 6 weeks. Procedure Code(s):    --- Professional ---                       [phone], Colonoscopy, flexible; diagnostic, including                        collection of specimen(s) by brushing or washing, when  performed (separate procedure) Diagnosis Code(s):    --- Professional ---                       K62.5, Hemorrhage of anus and rectum                       K64.0, First degree hemorrhoids CPT copyright 2016 American Medical Association. All rights reserved. The codes documented in this report are preliminary and upon coder review may  be revised to meet current compliance requirements. Wyline Mood, MD Wyline Mood MD, MD 09/08/2017 11:14:33 AM This report has been signed electronically. Number of Addenda: 0 Note Initiated On: 09/08/2017 10:24 AM Scope Withdrawal Time: 0 hours 9 minutes  33 seconds  Total Procedure Duration: 0 hours 10 minutes 26 seconds       [facility]

## 2017-09-08 NOTE — Anesthesia Preprocedure Evaluation (Signed)
Anesthesia Evaluation  Patient identified by MRN, date of birth, ID band Patient awake    Reviewed: Allergy & Precautions, NPO status , Patient's Chart, lab work & pertinent test results, reviewed documented beta blocker date and time   Airway Mallampati: III  TM Distance: >3 FB     Dental  (+) Chipped   Pulmonary Current Smoker,           Cardiovascular hypertension, Pt. on medications      Neuro/Psych  Headaches,    GI/Hepatic   Endo/Other    Renal/GU      Musculoskeletal   Abdominal   Peds  Hematology   Anesthesia Other Findings Obese.  Reproductive/Obstetrics                             Anesthesia Physical Anesthesia Plan  ASA: III  Anesthesia Plan: General   Post-op Pain Management:    Induction: Intravenous  PONV Risk Score and Plan:   Airway Management Planned:   Additional Equipment:   Intra-op Plan:   Post-operative Plan:   Informed Consent: I have reviewed the patients History and Physical, chart, labs and discussed the procedure including the risks, benefits and alternatives for the proposed anesthesia with the patient or authorized representative who has indicated his/her understanding and acceptance.     Plan Discussed with: CRNA  Anesthesia Plan Comments:         Anesthesia Quick Evaluation

## 2017-09-08 NOTE — H&P (Signed)
Brandon Mood MD [address], [address] [place], [place] [address] Phone: [phone] Fax : [phone]  Primary Care Physician:  Galen Manila, NP Primary Gastroenterologist:  Dr. Wyline Mccoy   Pre-Procedure History & Physical: HPI:  Brandon Mccoy is a 41 y.o. male is here for an endoscopy and colonoscopy.   Past Medical History:  Diagnosis Date  . Allergy   . Hypertension     Past Surgical History:  Procedure Laterality Date  . ANKLE SURGERY    . HAND SURGERY    . HERNIA REPAIR      Prior to Admission medications   Medication Sig Start Date End Date Taking? Authorizing Provider  amLODipine (NORVASC) 10 MG tablet Take 1 tablet (10 mg total) by mouth daily. 04/04/17  Yes Karamalegos, Netta Neat, DO  dicyclomine (BENTYL) 10 MG capsule Take 1 capsule (10 mg total) by mouth 4 (four) times daily -  before meals and at bedtime. 08/31/17  Yes Brandon Mood, MD  omeprazole (PRILOSEC) 40 MG capsule Take 1 capsule (40 mg total) by mouth daily. 08/31/17 10/31/17 Yes Brandon Mood, MD  Blood Pressure Monitoring (BLOOD PRESSURE CUFF) MISC 1 each by Does not apply route daily. 06/10/16   Loura Pardon, NP  buPROPion (WELLBUTRIN SR) 150 MG 12 hr tablet Take 1 tablet (150 mg total) by mouth 2 (two) times daily. Day 1-3: take one tablet daily.  Day 4: take 1 tablet twice per day. Patient not taking: Reported on 08/24/2017 05/11/17   Galen Manila, NP  ibuprofen (ADVIL,MOTRIN) 800 MG tablet TK 1 T PO TID PRN P AND SWELLING 10/06/16   [provider]    Allergies as of 08/24/2017 - Review Complete 08/24/2017  Allergen Reaction Noted  . Penicillins Other (See Comments) 04/14/2013    Family History  Problem Relation Age of Onset  . Heart disease Father        stent placement   . CVA Father   . Diabetes Father   . Heart attack Father 28  . Diabetes Mother     Social History   Social History  . Marital status: Single    Spouse name: N/A  . Number of children: N/A  .  Years of education: N/A   Occupational History  . Not on file.   Social History Main Topics  . Smoking status: Current Every Day Smoker    Packs/day: 0.50    Types: Cigarettes  . Smokeless tobacco: Never Used  . Alcohol use Yes     Comment: regularly  . Drug use: No  . Sexual activity: Not on file   Other Topics Concern  . Not on file   Social History Narrative  . No narrative on file    Review of Systems: See HPI, otherwise negative ROS  Physical Exam: There were no vitals taken for this visit. General:   Alert,  pleasant and cooperative in NAD Head:  Normocephalic and atraumatic. Neck:  Supple; no masses or thyromegaly. Lungs:  Clear throughout to auscultation.    Heart:  Regular rate and rhythm. Abdomen:  Soft, nontender and nondistended. Normal bowel sounds, without guarding, and without rebound.   Neurologic:  Alert and  oriented x4;  grossly normal neurologically.  Impression/Plan: Brandon Mccoy is here for an endoscopy and colonoscopy to be performed for abdominal pain and rectal bleeding   Risks, benefits, limitations, and alternatives regarding  endoscopy and colonoscopy have been reviewed with the patient.  Questions have been answered.  All parties agreeable.  Brandon Mood, MD  09/08/2017, 10:28 AM

## 2017-09-08 NOTE — Anesthesia Procedure Notes (Addendum)
Performed by: Tonia Ghent Pre-anesthesia Checklist: Patient identified, Emergency Drugs available, Suction available, Patient being monitored and Timeout performed Patient Re-evaluated:Patient Re-evaluated prior to induction Oxygen Delivery Method: Nasal cannula Preoxygenation: Pre-oxygenation with 100% oxygen Induction Type: IV induction Ventilation: Oral airway inserted - appropriate to patient size Airway Equipment and Method: Bite block Placement Confirmation: CO2 detector and positive ETCO2

## 2017-09-08 NOTE — Op Note (Signed)
[facility] Gastroenterology Patient Name: Brandon Mccoy Procedure Date: 09/08/2017 10:25 AM MRN: [id number] Account #: [id number] Date of Birth: 1976/05/29 Admit Type: Outpatient Age: 41 Room: [facility] ENDO ROOM 3 Gender: Male Note Status: Finalized Procedure:            Upper GI endoscopy Indications:          Abdominal pain in the left upper quadrant Providers:            Wyline Mood MD, MD Referring MD:         No Local Md, MD (Referring MD) Medicines:            Monitored Anesthesia Care Complications:        No immediate complications. Procedure:            Pre-Anesthesia Assessment:                       - Prior to the procedure, a History and Physical was                        performed, and patient medications, allergies and                        sensitivities were reviewed. The patient's tolerance of                        previous anesthesia was reviewed.                       - The risks and benefits of the procedure and the                        sedation options and risks were discussed with the                        patient. All questions were answered and informed                        consent was obtained.                       - ASA Grade Assessment: III - A patient with severe                        systemic disease.                       After obtaining informed consent, the endoscope was                        passed under direct vision. Throughout the procedure,                        the patient's blood pressure, pulse, and oxygen                        saturations were monitored continuously. The                        Colonoscope was introduced through the mouth, and  advanced to the third part of duodenum. The upper GI                        endoscopy was accomplished with ease. The patient                        tolerated the procedure well. Findings:      The examined duodenum was normal.      Localized mild  inflammation characterized by congestion (edema) and       erythema was found in the gastric antrum. Biopsies were taken with a       cold forceps for histology.      One tongue of salmon-colored mucosa was present from 41 to 44 cm. No       other visible abnormalities were present. The maximum longitudinal       extent of these esophageal mucosal changes was 3 cm in length. Biopsies       were taken with a cold forceps for histology.      The exam was otherwise without abnormality. Impression:           - Normal examined duodenum.                       - Gastritis. Biopsied.                       - Salmon-colored mucosa suggestive of short-segment                        Barrett's esophagus. Biopsied.                       - The examination was otherwise normal. Recommendation:       - Discharge patient to home.                       - Await pathology results.                       - Return to my office in 6 weeks.                       - Perform a colonoscopy today. Procedure Code(s):    --- Professional ---                       [phone], Esophagogastroduodenoscopy, flexible, transoral;                        with biopsy, single or multiple Diagnosis Code(s):    --- Professional ---                       K22.8, Other specified diseases of esophagus                       K29.70, Gastritis, unspecified, without bleeding                       R10.12, Left upper quadrant pain CPT copyright 2016 American Medical Association. All rights reserved. The codes documented in this report are preliminary and upon coder review may  be revised to meet current compliance requirements. Wyline Mood, MD Wyline Mood MD,  MD 09/08/2017 10:59:55 AM This report has been signed electronically. Number of Addenda: 0 Note Initiated On: 09/08/2017 10:25 AM      [facility]

## 2017-09-08 NOTE — Anesthesia Postprocedure Evaluation (Signed)
Anesthesia Post Note  Patient: Brandon Mccoy  Procedure(s) Performed: Procedure(s) (LRB): ESOPHAGOGASTRODUODENOSCOPY (EGD) WITH PROPOFOL (N/A) COLONOSCOPY WITH PROPOFOL (N/A)  Patient location during evaluation: Endoscopy Anesthesia Type: General Level of consciousness: awake and alert Pain management: pain level controlled Vital Signs Assessment: post-procedure vital signs reviewed and stable Respiratory status: spontaneous breathing, nonlabored ventilation, respiratory function stable and patient connected to nasal cannula oxygen Cardiovascular status: blood pressure returned to baseline and stable Postop Assessment: no apparent nausea or vomiting Anesthetic complications: no     Last Vitals:  Vitals:   09/08/17 1136 09/08/17 1147  BP: (!) 124/92 (!) 129/93  Pulse: 84 79  Resp: 17 15  Temp:    SpO2: 97% 96%    Last Pain:  Vitals:   09/08/17 1116  TempSrc: Tympanic                 [name]

## 2017-09-08 NOTE — Transfer of Care (Signed)
Immediate Anesthesia Transfer of Care Note  Patient: Brandon Mccoy  Procedure(s) Performed: Procedure(s): ESOPHAGOGASTRODUODENOSCOPY (EGD) WITH PROPOFOL (N/A) COLONOSCOPY WITH PROPOFOL (N/A)  Patient Location: PACU  Anesthesia Type:General  Level of Consciousness: awake and sedated  Airway & Oxygen Therapy: Patient Spontanous Breathing and Patient connected to face mask oxygen  Post-op Assessment: Report given to RN and Post -op Vital signs reviewed and stable  Post vital signs: Reviewed and stable  Last Vitals:  Vitals:   09/08/17 1029  BP: (!) 146/83  Pulse: 91  Resp: 20  Temp: 36.6 C  SpO2: 96%    Last Pain:  Vitals:   09/08/17 1029  TempSrc: Tympanic         Complications: No apparent anesthesia complications

## 2017-09-09 ENCOUNTER — Encounter: Payer: Self-pay | Admitting: Gastroenterology

## 2017-09-09 LAB — SURGICAL PATHOLOGY

## 2017-09-25 ENCOUNTER — Encounter: Payer: Self-pay | Admitting: Gastroenterology

## 2017-09-30 ENCOUNTER — Telehealth: Payer: Self-pay | Admitting: Gastroenterology

## 2017-09-30 NOTE — Telephone Encounter (Signed)
LVM for patient to call and schedule a 6 week follow up with Dr. Anna °

## 2017-10-03 ENCOUNTER — Encounter: Payer: Self-pay | Admitting: Gastroenterology

## 2017-10-03 ENCOUNTER — Telehealth: Payer: Self-pay | Admitting: Gastroenterology

## 2017-10-03 NOTE — Telephone Encounter (Signed)
Left a vm for patient to call and schedule a 6 week follow up appt with Dr. Tobi Bastos. Letter sent

## 2017-10-11 ENCOUNTER — Ambulatory Visit (INDEPENDENT_AMBULATORY_CARE_PROVIDER_SITE_OTHER): Payer: No Typology Code available for payment source | Admitting: Nurse Practitioner

## 2017-10-11 ENCOUNTER — Encounter: Payer: Self-pay | Admitting: Nurse Practitioner

## 2017-10-11 VITALS — BP 130/77 | HR 73 | Temp 97.6°F | Wt 240.2 lb

## 2017-10-11 DIAGNOSIS — Z9189 Other specified personal risk factors, not elsewhere classified: Secondary | ICD-10-CM

## 2017-10-11 DIAGNOSIS — N521 Erectile dysfunction due to diseases classified elsewhere: Secondary | ICD-10-CM

## 2017-10-11 DIAGNOSIS — G4730 Sleep apnea, unspecified: Secondary | ICD-10-CM | POA: Insufficient documentation

## 2017-10-11 DIAGNOSIS — R748 Abnormal levels of other serum enzymes: Secondary | ICD-10-CM

## 2017-10-11 MED ORDER — SILDENAFIL CITRATE 20 MG PO TABS: ORAL_TABLET | ORAL | 0 refills | Status: DC

## 2017-10-11 NOTE — Assessment & Plan Note (Signed)
Pt w/ persistently elevated liver enzymes.  GI workup revealed no immunity to Hepatitis A or B.  Recommend vaccination for Hep A and B.  Plan: 1. Will notify pt when Hep B vaccine obtained at clinic.  Can return for CMA only visit for vaccination of initial Hep A and Hep B same day.   Complete 2 doses Hep A and 3 doses Hep B.
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
## 2017-10-11 NOTE — Assessment & Plan Note (Signed)
Pt notes erectile dysfunction worsening over last several months.  Is now more important to patient as newlywed w/ spousal desire for regular sexual activity. - Complicated by obesity, possible obstructive sleep apnea.  Plan: 1. Discussed contributing factors including obesity and OSA.   2. Consider testosterone labs in future.  Pt declines today and will wait until OSA workup complete. 3. Encouraged increased physical activity.   4. START sildenafil 20 mg tablet.  Take 1-3 tablets prior to sexual intercourse for obtaining an erection.  Start w/ one tablet at first dose, then increase to 3 tablets total per dose as needed if unable to have an erection.   - Avoid all nitrates/nitroglycerin for any chest pain emergencies if using this medication.  - Priapism or having an erection lasting longer than 4 hours is an emergency condition possible when taking this medication.  Seek care in ED if this occurs. 5. Follow up 3 months after OSA workup.

## 2017-10-11 NOTE — Patient Instructions (Addendum)
Fayrene FearingJames, Thank you for coming in to clinic today.  1. For erectile dysfunction START sildenafil 20 mg tablet.  Take 1-3 tablets prior to sexual intercourse for obtaining an erection.  Start w/ one tablet at first dose, then increase to 3 tablets total per dose as needed if unable to have an erection.   - Avoid all nitrates/nitroglycerin for any chest pain emergencies if using this medication.  - Priapism or having an erection lasting longer than 4 hours is an emergency condition possible when taking this medication.  Seek care in ED if this occurs.  2. For Hepatitis A: You have gotten your first vaccine today.  You will need your second dose in about 6 months.  3. For Hepatitis B: We will call you when we get these vaccines in.  Call and schedule your vaccines with the CMAs.  4. For your sleep study, [facility] will call you for your appointment. They will let you know whether in home or sleep center is covered.  Please schedule a follow-up appointment with Wilhelmina McardleLauren Kevonta Phariss, AGNP Return if symptoms worsen or fail to improve AND in about 3 months (around 01/11/2018), for or sooner for sleep study followup.  If you have any other questions or concerns, please feel free to call the clinic or send a message through MyChart. You may also schedule an earlier appointment if necessary.  You will receive a survey after today's visit either digitally by e-mail or paper by [employer] mail. Your experiences and feedback matter to us.  Please respond so we know how we are doing as we provide care for you.   Wilhelmina McardleLauren Love Chowning, DNP, AGNP-BC Adult Gerontology Nurse Practitioner [facility], [facility]

## 2017-10-11 NOTE — Assessment & Plan Note (Signed)
Pt w/ BMI 35.5.  Also has comorbidity of hypertension and possible obstructive sleep apnea.  Pt has had stable weight w/ about 6 lbs weight loss in last 4 weeks.  Plan: 1. Encouraged healthy diet and increased physical activity. 2. Follow up as needed and at least once annually.

## 2017-11-07 ENCOUNTER — Encounter: Payer: Self-pay | Admitting: Gastroenterology

## 2017-11-07 ENCOUNTER — Ambulatory Visit (INDEPENDENT_AMBULATORY_CARE_PROVIDER_SITE_OTHER): Payer: No Typology Code available for payment source | Admitting: Gastroenterology

## 2017-11-07 VITALS — BP 137/79 | HR 79 | Temp 97.7°F | Ht 69.0 in | Wt 243.6 lb

## 2017-11-07 DIAGNOSIS — Z791 Long term (current) use of non-steroidal anti-inflammatories (NSAID): Secondary | ICD-10-CM | POA: Diagnosis not present

## 2017-11-07 DIAGNOSIS — R1012 Left upper quadrant pain: Secondary | ICD-10-CM | POA: Diagnosis not present

## 2017-11-07 MED ORDER — SUCRALFATE 1 GM/10ML PO SUSP: 1.0000 g | Freq: Four times a day (QID) | ORAL | 1 refills | Status: DC

## 2017-11-07 NOTE — Progress Notes (Signed)
Wyline MoodKiran Ayodele Hartsock MD, MRCP(U.K) [address]  [address]  [place], [place] [address]  Main: [phone]  Fax: [phone]   Primary Care Physician: Galen ManilaKennedy, Lauren Renee, NP  Primary Gastroenterologist:  Brandon Mccoy. Wyline MoodKiran Makyle Eslick   Chief Complaint  Patient presents with  . Follow-up    HPI: Brandon Mccoy Mccoy is a 41 y.o. male   Summary of history :  He was initially seen and referred on 08/24/17 for abdominal pain which began 2 years back. In 03/2017 thought he was havng a heart attack , pulled over , when he lay down, got better. Has had several tests and was told his heart was normal , was also told it was musculoskeletal and saw another doctor for the same and was told it was not related to his muscles. The pain is over his ribs, on and off , once every few days, occurs everyday. 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 Uses Ibuprofen for headaches 1-2 times a week. He has a bowel movement once a day , 2 months saw blood in his stools, mixed with his stool.H pylori breath test 05/2017 - negative,CBC,lipase -normal ,AST 56,ALT 70    Interval history   08/24/2017-  11/07/2017   09/08/17- EGD- reactive gastropathy -negative for H pylori . Esophageal biopsies showed mild chronic inflammation. Colonoscopy - internal hemorroids   Labs 08/2017- CBC,LFT,iron studies, F actin, immunoglobulins ,ceruloplasmin, A1AT, HIV,HCV,Hep b serology--negative  USG RUQ ordered but not obtained.  Overall since the last visit feels better. Last week when he was driving truck had 1 episode of abdominal pain.   On Prilosec which he says has helped. Lost 3 lbs weight. When he has the pain at times is right after he eats, lasts about 6-7 hours .  Occasional use of Ibuprofen-was taking it more often in the past but has cut down.    Current Outpatient Medications  Medication Sig Dispense Refill  . amLODipine (NORVASC) 10 MG tablet Take 1 tablet  (10 mg total) by mouth daily. 90 tablet 3  . Blood Pressure Monitoring (BLOOD PRESSURE CUFF) MISC 1 each by Does not apply route daily. 1 each 0  . dicyclomine (BENTYL) 10 MG capsule Take 1 capsule (10 mg total) by mouth 4 (four) times daily -  before meals and at bedtime. 90 capsule 0  . sildenafil (REVATIO) 20 MG tablet Take 1-3 tablets once as needed to obtain an erection. 30 tablet 0  . omeprazole (PRILOSEC) 40 MG capsule Take 1 capsule (40 mg total) by mouth daily. 90 capsule 3   No current facility-administered medications for this visit.     Allergies as of 11/07/2017 - Review Complete 11/07/2017  Allergen Reaction Noted  . Penicillins Other (See Comments) 04/14/2013    ROS:  General: Negative for anorexia, weight loss, fever, chills, fatigue, weakness. ENT: Negative for hoarseness, difficulty swallowing , nasal congestion. CV: Negative for chest pain, angina, palpitations, dyspnea on exertion, peripheral edema.  Respiratory: Negative for dyspnea at rest, dyspnea on exertion, cough, sputum, wheezing.  GI: See history of present illness. GU:  Negative for dysuria, hematuria, urinary incontinence, urinary frequency, nocturnal urination.  Endo: Negative for unusual weight change.    Physical Examination:   BP 137/79 (BP Location: Left Arm, Patient Position: Sitting, Cuff Size: Large)   Pulse 79   Temp 97.7 F (36.5 C) (Oral)   Ht 5\' 9"  (  1.753 m)   Wt 243 lb 9.6 oz (110.5 kg)   BMI 35.97 kg/m   General: Well-nourished, well-developed in no acute distress.  Eyes: No icterus. Conjunctivae pink. Mouth: Oropharyngeal mucosa moist and pink , no lesions erythema or exudate. Lungs: Clear to auscultation bilaterally. Non-labored. Heart: Regular rate and rhythm, no murmurs rubs or gallops.  Abdomen: Bowel sounds are normal, nontender, nondistended, no hepatosplenomegaly or masses, no abdominal bruits or hernia , no rebound or guarding.   Extremities: No lower extremity edema. No  clubbing or deformities. Neuro: Alert and oriented x 3.  Grossly intact. Skin: Warm and dry, no jaundice.   Psych: Alert and cooperative, normal mood and affect.   Imaging Studies: No results found.  Assessment and Plan:   Brandon Mccoy Mccoy is a 41 y.o. 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 Pain could be from NSAID use vs GERD vs biliary origin (although his symptoms are not very typical)  Plan  1. Cntinue Prilosec 40 mg Q daily for empiric treatment for dyspepsia, PRN Bentyl, carafate 2. RUQ USG to r/o gall stones 3. Stop Ibuprofen  4. Awaiting Hep A/B vaccine  5. If above tests are negative then may need a CT scan of the abdomen  Brandon Mccoy Wyline MoodKiran Mose Colaizzi  MD,MRCP [facility](U.K) Follow up in 3 months

## 2017-11-14 ENCOUNTER — Ambulatory Visit
Admission: RE | Admit: 2017-11-14 | Discharge: 2017-11-14 | Disposition: A | Discharge status: Home / Self Care | Payer: No Typology Code available for payment source | Source: Ambulatory Visit | Attending: Gastroenterology | Admitting: Gastroenterology

## 2017-11-14 DIAGNOSIS — K824 Cholesterolosis of gallbladder: Secondary | ICD-10-CM | POA: Diagnosis not present

## 2017-11-14 DIAGNOSIS — R945 Abnormal results of liver function studies: Secondary | ICD-10-CM | POA: Insufficient documentation

## 2017-11-14 DIAGNOSIS — R7989 Other specified abnormal findings of blood chemistry: Secondary | ICD-10-CM

## 2017-11-14 DIAGNOSIS — K760 Fatty (change of) liver, not elsewhere classified: Secondary | ICD-10-CM | POA: Insufficient documentation

## 2017-11-15 ENCOUNTER — Other Ambulatory Visit: Payer: Self-pay

## 2017-11-15 MED ORDER — SUCRALFATE 1 G PO TABS: 1.0000 g | ORAL_TABLET | Freq: Three times a day (TID) | ORAL | 0 refills | Status: DC

## 2017-11-18 ENCOUNTER — Encounter: Payer: Self-pay | Admitting: Gastroenterology

## 2017-12-26 ENCOUNTER — Ambulatory Visit: Payer: No Typology Code available for payment source | Admitting: Gastroenterology

## 2018-01-18 ENCOUNTER — Ambulatory Visit: Payer: No Typology Code available for payment source | Admitting: Gastroenterology

## 2018-02-15 ENCOUNTER — Telehealth: Payer: Self-pay | Admitting: Gastroenterology

## 2018-02-15 NOTE — Telephone Encounter (Signed)
LVM to call our office for a recall appt. 

## 2018-02-16 NOTE — Telephone Encounter (Signed)
Pt left vm to see who called him and why, I returned his call  Informing him he needs to schedule a 3 month Fu with Dr. Tobi BastosAnna

## 2018-03-13 ENCOUNTER — Other Ambulatory Visit: Payer: Self-pay | Admitting: Nurse Practitioner

## 2018-03-13 DIAGNOSIS — N521 Erectile dysfunction due to diseases classified elsewhere: Secondary | ICD-10-CM

## 2018-03-15 ENCOUNTER — Other Ambulatory Visit: Payer: Self-pay | Admitting: Nurse Practitioner

## 2018-03-15 DIAGNOSIS — N521 Erectile dysfunction due to diseases classified elsewhere: Secondary | ICD-10-CM

## 2018-03-16 MED ORDER — SILDENAFIL CITRATE 20 MG PO TABS: ORAL_TABLET | ORAL | 0 refills | Status: DC

## 2018-05-09 ENCOUNTER — Other Ambulatory Visit: Payer: Self-pay

## 2018-05-09 DIAGNOSIS — I10 Essential (primary) hypertension: Secondary | ICD-10-CM

## 2018-05-09 MED ORDER — AMLODIPINE BESYLATE 10 MG PO TABS: 10.0000 mg | ORAL_TABLET | Freq: Every day | ORAL | 3 refills | Status: DC

## 2018-05-20 ENCOUNTER — Other Ambulatory Visit: Payer: Self-pay | Admitting: Nurse Practitioner

## 2018-05-20 DIAGNOSIS — N521 Erectile dysfunction due to diseases classified elsewhere: Secondary | ICD-10-CM

## 2018-05-27 ENCOUNTER — Other Ambulatory Visit: Payer: Self-pay | Admitting: Nurse Practitioner

## 2018-05-27 DIAGNOSIS — N521 Erectile dysfunction due to diseases classified elsewhere: Secondary | ICD-10-CM

## 2018-05-30 ENCOUNTER — Telehealth: Payer: Self-pay | Admitting: Nurse Practitioner

## 2018-05-30 NOTE — Telephone Encounter (Signed)
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  He has an appt scheduled for 7/1.  He said he had discussed this with Lauren previously but his insurance wouldn't pay but he has a different insurance now.  Does he need to be seen [phone]

## 2018-05-30 NOTE — Telephone Encounter (Signed)
Likely will need visit with me within 90 days of study.  We can resubmit after his 06/19/2018 appointment

## 2018-05-30 NOTE — Telephone Encounter (Signed)
Pt requesting that we submit a new order for a sleep study since his insurance has changed. He address his sleep issues  with you at a previous visit 6 mths ago.

## 2018-06-01 ENCOUNTER — Telehealth: Payer: Self-pay

## 2018-06-01 NOTE — Telephone Encounter (Signed)
Sleep study fax over to [facility]. Pending appt.

## 2018-06-01 NOTE — Telephone Encounter (Signed)
Sleep Study fax over to [facility].

## 2018-06-15 DIAGNOSIS — T22211A Burn of second degree of right forearm, initial encounter: Secondary | ICD-10-CM | POA: Diagnosis not present

## 2018-06-15 DIAGNOSIS — T24231A Burn of second degree of right lower leg, initial encounter: Secondary | ICD-10-CM | POA: Diagnosis not present

## 2018-06-19 ENCOUNTER — Ambulatory Visit: Payer: Self-pay | Admitting: Nurse Practitioner

## 2018-06-20 ENCOUNTER — Ambulatory Visit (INDEPENDENT_AMBULATORY_CARE_PROVIDER_SITE_OTHER): Payer: BLUE CROSS/BLUE SHIELD | Admitting: Nurse Practitioner

## 2018-06-20 ENCOUNTER — Encounter: Payer: Self-pay | Admitting: Nurse Practitioner

## 2018-06-20 VITALS — BP 122/83 | HR 90 | Temp 98.5°F | Resp 16 | Ht 69.0 in | Wt 248.0 lb

## 2018-06-20 DIAGNOSIS — T310 Burns involving less than 10% of body surface: Secondary | ICD-10-CM | POA: Diagnosis not present

## 2018-06-20 DIAGNOSIS — G4733 Obstructive sleep apnea (adult) (pediatric): Secondary | ICD-10-CM | POA: Diagnosis not present

## 2018-06-20 DIAGNOSIS — N521 Erectile dysfunction due to diseases classified elsewhere: Secondary | ICD-10-CM | POA: Diagnosis not present

## 2018-06-20 DIAGNOSIS — Z9229 Personal history of other drug therapy: Secondary | ICD-10-CM

## 2018-06-20 DIAGNOSIS — Z23 Encounter for immunization: Secondary | ICD-10-CM | POA: Diagnosis not present

## 2018-06-20 MED ORDER — SILDENAFIL CITRATE 20 MG PO TABS: ORAL_TABLET | ORAL | 1 refills | Status: DC
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
## 2018-07-17 DIAGNOSIS — G4733 Obstructive sleep apnea (adult) (pediatric): Secondary | ICD-10-CM | POA: Diagnosis not present

## 2018-07-18 ENCOUNTER — Other Ambulatory Visit: Payer: Self-pay | Admitting: Nurse Practitioner

## 2018-07-18 ENCOUNTER — Telehealth: Payer: Self-pay

## 2018-07-18 DIAGNOSIS — N521 Erectile dysfunction due to diseases classified elsewhere: Secondary | ICD-10-CM

## 2018-07-18 MED ORDER — SILDENAFIL CITRATE 20 MG PO TABS: ORAL_TABLET | ORAL | 1 refills | Status: AC

## 2018-07-18 NOTE — Telephone Encounter (Signed)
The pt his Sildenafil prescription [facility] [place].

## 2018-07-18 NOTE — Telephone Encounter (Signed)
Sent in orders only encounter

## 2018-07-20 ENCOUNTER — Telehealth: Payer: Self-pay

## 2018-07-20 NOTE — Telephone Encounter (Signed)
The pt called to check on the status of his sleep study that he recently had done. 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

## 2018-07-21 ENCOUNTER — Encounter: Payer: Self-pay | Admitting: Nurse Practitioner

## 2018-07-21 NOTE — Telephone Encounter (Signed)
Order received, signed, and refaxed to [facility].

## 2018-07-21 NOTE — Telephone Encounter (Signed)
Patient had original sleep study 7/2.  Has had CPAP titration study on 7/28.  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  Awaiting orders for machine from [facility]. -Patient also stated he will need payment plan option for CPAP machine. May need to order DME from [facility].

## 2018-07-26 ENCOUNTER — Encounter: Payer: Self-pay | Admitting: Nurse Practitioner

## 2018-08-03 DIAGNOSIS — G4733 Obstructive sleep apnea (adult) (pediatric): Secondary | ICD-10-CM | POA: Diagnosis not present

## 2018-08-30 ENCOUNTER — Other Ambulatory Visit: Payer: Self-pay | Admitting: Nurse Practitioner

## 2018-08-30 NOTE — Telephone Encounter (Signed)
Pt called requesting a 90 day supply of amlodipine called into [facility] Pt call back # is [phone]

## 2018-08-30 NOTE — Telephone Encounter (Signed)
Attempted to contact the pt to verify what pharmacy, because the prescription was previously sent over for a year worth, 90 day prescription to [facility].

## 2018-08-31 ENCOUNTER — Other Ambulatory Visit: Payer: Self-pay

## 2018-08-31 DIAGNOSIS — I10 Essential (primary) hypertension: Secondary | ICD-10-CM

## 2018-08-31 MED ORDER — AMLODIPINE BESYLATE 10 MG PO TABS: 10.0000 mg | ORAL_TABLET | Freq: Every day | ORAL | 0 refills | Status: DC

## 2018-08-31 NOTE — Telephone Encounter (Signed)
Attempted to contact the patient, no answer. lmom to return my call.  

## 2018-09-03 DIAGNOSIS — G4733 Obstructive sleep apnea (adult) (pediatric): Secondary | ICD-10-CM | POA: Diagnosis not present

## 2018-09-19 ENCOUNTER — Encounter: Payer: Self-pay | Admitting: Nurse Practitioner

## 2018-09-22 ENCOUNTER — Other Ambulatory Visit: Payer: Self-pay | Admitting: Nurse Practitioner

## 2018-09-22 DIAGNOSIS — I10 Essential (primary) hypertension: Secondary | ICD-10-CM

## 2018-10-03 DIAGNOSIS — G4733 Obstructive sleep apnea (adult) (pediatric): Secondary | ICD-10-CM | POA: Diagnosis not present

## 2018-10-20 ENCOUNTER — Ambulatory Visit: Payer: BLUE CROSS/BLUE SHIELD | Admitting: Nurse Practitioner

## 2018-11-03 ENCOUNTER — Other Ambulatory Visit: Payer: Self-pay

## 2018-11-03 ENCOUNTER — Ambulatory Visit: Payer: BLUE CROSS/BLUE SHIELD | Admitting: Nurse Practitioner

## 2018-11-03 ENCOUNTER — Encounter: Payer: Self-pay | Admitting: Nurse Practitioner

## 2018-11-03 VITALS — BP 150/75 | HR 92 | Temp 98.4°F | Ht 69.0 in | Wt 243.8 lb

## 2018-11-03 DIAGNOSIS — R1012 Left upper quadrant pain: Secondary | ICD-10-CM

## 2018-11-03 DIAGNOSIS — R0789 Other chest pain: Secondary | ICD-10-CM | POA: Diagnosis not present

## 2018-11-03 DIAGNOSIS — Z8249 Family history of ischemic heart disease and other diseases of the circulatory system: Secondary | ICD-10-CM

## 2018-11-03 MED ORDER — SUCRALFATE 1 G PO TABS: 1.0000 g | ORAL_TABLET | Freq: Three times a day (TID) | ORAL | 0 refills | Status: DC

## 2018-11-03 MED ORDER — OMEPRAZOLE 40 MG PO CPDR: 40.0000 mg | DELAYED_RELEASE_CAPSULE | Freq: Every day | ORAL | 3 refills | Status: DC

## 2018-11-03 NOTE — Progress Notes (Signed)
Subjective:    Patient ID: Brandon Mccoy, male    DOB: 02-10-1976, 42 y.o.   MRN: [id number]  Gerald Kuehl is a 42 y.o. male presenting on 11/03/2018 for Abdominal Pain (chronic intermittent abdominal pain. Pt states he stop eating french fries and taking  Carafate before meals made symptom side )   HPI Abdominal Pain/Chest pain/Arm pain When initial appointment was made, he had severe abdominal pain and heartburn.  Has had negative cardiac workup. Has improved since stopping fried foods.  Continues to take carafate, alternating omeprazole.  These are symptoms similar to his episode of chest pain/abdominal pain in may 2018 when he had negative cardiac workup and GI workup with atypical abdominal pain c/w NSAID use, GERD, biliary pain.  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 - With perceived chest pain, patient is concerned for family history of CAD.  - Patient also notes occasional neck left arm pain/numbness that is positional. Patient has been CDL/DOT licensed truck driver for many years.  Arm pain is also not always associated with abdominal pain.  Social History   Tobacco Use  . Smoking status: Current Every Day Smoker    Packs/day: 0.50    Types: Cigarettes  . Smokeless tobacco: Never Used  Substance Use Topics  . Alcohol use: Yes    Comment: regularly  . Drug use: No    Review of Systems Per HPI unless specifically indicated above     Objective:    BP (!) 150/75 (BP Location: Right Arm, Patient Position: Sitting, Cuff Size: Normal)   Pulse 92   Temp 98.4 F (36.9 C) (Oral)   Ht 5\' 9"  (1.753 m)   Wt 243 lb 12.8 oz (110.6 kg)   BMI 36.00 kg/m   Wt Readings from Last 3 Encounters:  11/03/18 243 lb 12.8 oz (110.6 kg)  06/20/18 248 lb (112.5 kg)  11/07/17 243 lb 9.6 oz (110.5 kg)    Physical Exam  Constitutional: He is oriented to person, place, and time. He appears well-developed and well-nourished. No distress.  HENT:  Head: Normocephalic and atraumatic.  Neck: Normal range  of motion. Neck supple. Carotid bruit is not present.  Cardiovascular: Normal rate, regular rhythm, S1 normal, S2 normal, normal heart sounds and intact distal pulses.  Pulmonary/Chest: Effort normal and breath sounds normal. No respiratory distress.  Abdominal: Soft. Bowel sounds are normal. He exhibits no distension. There is no hepatosplenomegaly. There is no tenderness. No hernia.  Musculoskeletal: He exhibits no edema (pedal).  Bilateral Shoulders Inspection: Normal appearance bilateral symmetrical Palpation: Non-tender to palpation over anterior, lateral, or posterior shoulder  ROM: Full intact active ROM forward flexion, abduction, internal / external rotation, symmetrical Special Testing: Rotator cuff testing negative for weakness with supraspinatus full can and empty can test, O'brien's negative for labral pain, Hawkin's AC impingement negative for pain Strength: Normal strength 5/5 flex/ext, ext rot / int rot, grip, rotator cuff str testing. Neurovascular: Distally intact pulses, sensation to light touch  Neurological: He is alert and oriented to person, place, and time.  Skin: Skin is warm and dry. Capillary refill takes less than 2 seconds.  Psychiatric: He has a normal mood and affect. His behavior is normal.  Vitals reviewed.   11/03/18 EKG: NSR  HR 90bpm PR QT   QTc QRS 86ms No signs of ischemia or MI as cause of abdominal, arm pain.      Assessment & Plan:   Problem List Items Addressed This Visit  None    Visit Diagnoses    Family history of early CAD    -  Primary   Relevant Orders   CT CARDIAC SCORING   Intermittent left-sided chest pain       Relevant Orders   CT CARDIAC SCORING   LUQ pain       Relevant Medications   omeprazole (PRILOSEC) 40 MG capsule   sucralfate (CARAFATE) 1 g tablet      # Acute on chronic LUQ abdominal pain/perceived chest pain.  Neg EKG today suggests no ischemia or past ischemia.  Unlikely cardiac due to EKG and  interval improvement with PPI and carafate use. Likely is PUD, GERD, vs gastritis and arm pain related to possible nerve impingement due to prolonged periods of time with arms extended while driving.  Plan: 1. EKG today - NSR.  Reviewed with patient and he verbalizes understanding of when to seek care in ED. 2. Continue prilosec and carafate. 3. Continue work toward diet improvement to help with weight loss and management of NAFLD. 4. Can consider return to either cardiology or GI if needed for persistent symptoms. 5. Can consider orthopedic referral for arm pain/numbness or OT in future. 6. Follow-up 3 months.  Meds ordered this encounter  Medications  . omeprazole (PRILOSEC) 40 MG capsule    Sig: Take 1 capsule (40 mg total) by mouth daily.    Dispense:  90 capsule    Refill:  3    Order Specific Question:   Supervising Provider    Answer:   Smitty CordsKARAMALEGOS, ALEXANDER J [[id number]]  . sucralfate (CARAFATE) 1 g tablet    Sig: Take 1 tablet (1 g total) by mouth 4 (four) times daily -  with meals and at bedtime.    Dispense:  120 tablet    Refill:  0    Order Specific Question:   Supervising Provider    Answer:   Smitty CordsKARAMALEGOS, ALEXANDER J [[id number]]    Follow up plan: Return in about 3 months (around 02/03/2019) for GERD.  Wilhelmina McardleLauren Asiah Browder, DNP, AGPCNP-BC Adult Gerontology Primary Care Nurse Practitioner [facility] 11/03/2018, 1:31 PM

## 2018-11-03 NOTE — Patient Instructions (Addendum)
Brandon Mccoy,   Thank you for coming in to clinic today.  1. Most likely is gastritis or peptic ulcer disease.  Arm pain could be musculoskeletal since it does not happen with abdominal pain all the time. - Continue omeprazole 20 mg daily. - Reduces stomach acid. - Continue sucralfate with meals and at bedtime - this protects stomach lining with meals.  2. Recommend EKG today.   - Consider following up on your coronary artery calcium scoring recommended by Dr. Kirke CorinArida.  Order placed and [facility] will call you. - If pain worsens, have chest pressure/tightness do not hesitate to have cardiac workup in ER or urgently back with Dr. Kirke CorinArida.    Please schedule a follow-up appointment with Brandon Mccoy, AGNP. Return in about 3 months (around 02/03/2019) for GERD.  If you have any other questions or concerns, please feel free to call the clinic or send a message through MyChart. You may also schedule an earlier appointment if necessary.  You will receive a survey after today's visit either digitally by e-mail or paper by [employer] mail. Your experiences and feedback matter to us.  Please respond so we know how we are doing as we provide care for you.   Brandon McardleLauren [name], DNP, AGNP-BC Adult Gerontology Nurse Practitioner [facility], [facility]

## 2018-11-10 ENCOUNTER — Encounter: Payer: Self-pay | Admitting: Nurse Practitioner

## 2018-11-14 ENCOUNTER — Ambulatory Visit (HOSPITAL_COMMUNITY): Payer: BLUE CROSS/BLUE SHIELD

## 2018-12-04 ENCOUNTER — Ambulatory Visit (HOSPITAL_COMMUNITY): Payer: BLUE CROSS/BLUE SHIELD

## 2018-12-05 ENCOUNTER — Encounter: Payer: Self-pay | Admitting: Emergency Medicine

## 2018-12-05 ENCOUNTER — Emergency Department: Payer: BLUE CROSS/BLUE SHIELD

## 2018-12-05 ENCOUNTER — Emergency Department
Admission: EM | Admit: 2018-12-05 | Discharge: 2018-12-05 | Disposition: A | Discharge status: Home / Self Care | Payer: BLUE CROSS/BLUE SHIELD | Attending: Emergency Medicine | Admitting: Emergency Medicine

## 2018-12-05 ENCOUNTER — Other Ambulatory Visit: Payer: Self-pay

## 2018-12-05 DIAGNOSIS — I10 Essential (primary) hypertension: Secondary | ICD-10-CM | POA: Insufficient documentation

## 2018-12-05 DIAGNOSIS — R079 Chest pain, unspecified: Secondary | ICD-10-CM

## 2018-12-05 DIAGNOSIS — F17210 Nicotine dependence, cigarettes, uncomplicated: Secondary | ICD-10-CM | POA: Diagnosis not present

## 2018-12-05 DIAGNOSIS — R200 Anesthesia of skin: Secondary | ICD-10-CM | POA: Diagnosis not present

## 2018-12-05 DIAGNOSIS — R202 Paresthesia of skin: Secondary | ICD-10-CM | POA: Insufficient documentation

## 2018-12-05 DIAGNOSIS — Z79899 Other long term (current) drug therapy: Secondary | ICD-10-CM | POA: Insufficient documentation

## 2018-12-05 DIAGNOSIS — R0789 Other chest pain: Secondary | ICD-10-CM | POA: Diagnosis not present

## 2018-12-05 LAB — CBC
HCT: 48 % (ref 39.0–52.0)
Hemoglobin: 15.9 g/dL (ref 13.0–17.0)
MCH: 30.5 pg (ref 26.0–34.0)
MCHC: 33.1 g/dL (ref 30.0–36.0)
MCV: 92.1 fL (ref 80.0–100.0)
Platelets: 256 10*3/uL (ref 150–400)
RBC: 5.21 MIL/uL (ref 4.22–5.81)
RDW: 12.6 % (ref 11.5–15.5)
WBC: 11.7 10*3/uL — ABNORMAL HIGH (ref 4.0–10.5)
nRBC: 0 % (ref 0.0–0.2)

## 2018-12-05 LAB — BASIC METABOLIC PANEL
ANION GAP: 9 (ref 5–15)
BUN: 16 mg/dL (ref 6–20)
CO2: 25 mmol/L (ref 22–32)
Calcium: 9.1 mg/dL (ref 8.9–10.3)
Chloride: 105 mmol/L (ref 98–111)
Creatinine, Ser: 1.08 mg/dL (ref 0.61–1.24)
GFR calc Af Amer: >60 mL/min (ref >60)
GFR calc non Af Amer: >60 mL/min (ref >60)
Glucose, Bld: 107 mg/dL — ABNORMAL HIGH (ref 70–99)
Potassium: 3.6 mmol/L (ref 3.5–5.1)
Sodium: 139 mmol/L (ref 135–145)

## 2018-12-05 LAB — TROPONIN I
Troponin I: <0.03 ng/mL (ref <0.03)
Troponin I: <0.03 ng/mL (ref <0.03)

## 2018-12-05 MED ORDER — CYCLOBENZAPRINE HCL 10 MG PO TABS
5.0000 mg | ORAL_TABLET | Freq: Once | ORAL | Status: AC
  Administered 2018-12-05: 5 mg via ORAL
  Filled 2018-12-05: qty 1

## 2018-12-05 NOTE — ED Provider Notes (Signed)
[facility] Emergency Department Provider Note  ____________________________________________   First MD Initiated Contact with Patient 12/05/18 [phone]     (approximate)  I have reviewed the triage vital signs and the nursing notes.   HISTORY  Chief Complaint Chest Pain   HPI Brandon Mccoy is a 42 y.o. male with a history of hypertension, GERD as well as chronic chest pain who was presented emergency department left-sided chest pain.  He says that his chest pain this time around started approximately 10 AM.  He says that the pain is sharp and radiating down his left arm.  He also feels numbness and tingling intermittently to his left arm.  Denies any shortness of breath, nausea vomiting or diaphoresis.  Denies that the pain worsens with deep breathing.  Denies history of blood clot.  Says that he is concerned because he has a history of cardiac disease in his family.  Says that over the course of the year he has had an EKG as well as a stress test through his primary care doctor.  He says that the stress test was normal.  However, because of the persistent chest pain he says that he requested a CT of his coronary arteries which has been scheduled for December 31.  However, with the episode today, he decided to come to the emergency department for further evaluation.  He says the pain is decreased when he raises his left arm above his head.  Describes the pain as an 8 out of 10 at this time.  Does not report any injury or heavy lifting.    Past Medical History:  Diagnosis Date  . Allergy   . Hypertension     Patient Active Problem List   Diagnosis Date Noted  . Erectile dysfunction due to diseases classified elsewhere 10/11/2017  . Sleep apnea in adult 10/11/2017  . HLD (hyperlipidemia) 10/19/2016  . Severe obesity with body mass index (BMI) of 35.0 to 39.9 with comorbidity (HCC) 10/19/2016  . Elevated liver enzymes 09/10/2016  . HTN (hypertension) 04/27/2016  .  Tobacco abuse 04/27/2016    Past Surgical History:  Procedure Laterality Date  . ANKLE SURGERY    . COLONOSCOPY WITH PROPOFOL N/A 09/08/2017   Procedure: COLONOSCOPY WITH PROPOFOL;  Surgeon: Wyline MoodAnna, Kiran, MD;  [facility];  Service: Gastroenterology;  Laterality: N/A;  . ESOPHAGOGASTRODUODENOSCOPY (EGD) WITH PROPOFOL N/A 09/08/2017   Procedure: ESOPHAGOGASTRODUODENOSCOPY (EGD) WITH PROPOFOL;  Surgeon: Wyline MoodAnna, Kiran, MD;  [facility];  Service: Gastroenterology;  Laterality: N/A;  . HAND SURGERY    . HERNIA REPAIR      Prior to Admission medications   Medication Sig Start Date End Date Taking? Authorizing Provider  amLODipine (NORVASC) 10 MG tablet TAKE 1 TABLET BY MOUTH EVERY DAY 09/22/18  Yes Galen ManilaKennedy, Lauren Renee, NP  omeprazole (PRILOSEC) 40 MG capsule Take 1 capsule (40 mg total) by mouth daily. 11/03/18 01/03/19 Yes Galen ManilaKennedy, Lauren Renee, NP  sildenafil (REVATIO) 20 MG tablet Take 1-3 tablets once as needed to obtain an erection. 07/18/18  Yes Galen ManilaKennedy, Lauren Renee, NP  sucralfate (CARAFATE) 1 g tablet Take 1 tablet (1 g total) by mouth 4 (four) times daily -  with meals and at bedtime. 11/03/18 12/05/18 Yes Galen ManilaKennedy, Lauren Renee, NP  Blood Pressure Monitoring (BLOOD PRESSURE CUFF) MISC 1 each by Does not apply route daily. 06/10/16   Loura PardonKrebs, Amy Lauren, NP    Allergies Penicillins  Family History  Problem Relation Age of Onset  . Heart disease  Father        stent placement   . CVA Father   . Diabetes Father   . Heart attack Father 95  . Diabetes Mother     Social History Social History   Tobacco Use  . Smoking status: Current Every Day Smoker    Packs/day: 0.50    Types: Cigarettes  . Smokeless tobacco: Never Used  Substance Use Topics  . Alcohol use: Yes    Comment: regularly  . Drug use: No    Review of Systems  Constitutional: No fever/chills Eyes: No visual changes. ENT: No sore throat. Cardiovascular: As above Respiratory: Denies  shortness of breath. Gastrointestinal: No abdominal pain.  No nausea, no vomiting.  No diarrhea.  No constipation. Genitourinary: Negative for dysuria. Musculoskeletal: Negative for back pain. Skin: Negative for rash. Neurological: Negative for headaches, focal weakness or numbness.   ____________________________________________   PHYSICAL EXAM:  VITAL SIGNS: ED Triage Vitals [12/05/18 1756]  Enc Vitals Group     BP (!) 153/89     Pulse Rate 91     Resp (!) 22     Temp 98.2 F (36.8 C)     Temp Source Oral     SpO2 95 %     Weight 242 lb (109.8 kg)     Height 5\' 9"  (1.753 m)     Head Circumference      Peak Flow      Pain Score 6     Pain Loc      Pain Edu?      Excl. in GC?     Constitutional: Alert and oriented. Well appearing and in no acute distress. Eyes: Conjunctivae are normal.  Head: Atraumatic. Nose: No congestion/rhinnorhea. Mouth/Throat: Mucous membranes are moist.  Neck: No stridor.   Cardiovascular: Normal rate, regular rhythm. Grossly normal heart sounds.  Equal bilateral radial as well as dorsalis pedis pulses.  Chest pain is reproducible over the anterior, lower chest wall as well as laterally on the chest wall.  No ecchymosis or deformity visualized.  No crepitus palpated. Respiratory: Normal respiratory effort.  No retractions. Lungs CTAB. Gastrointestinal: Soft and nontender. No distention.  Musculoskeletal: No lower extremity tenderness nor edema.  No joint effusions. Neurologic:  Normal speech and language. No gross focal neurologic deficits are appreciated. Skin:  Skin is warm, dry and intact. No rash noted. Psychiatric: Mood and affect are normal. Speech and behavior are normal.  ____________________________________________   LABS (all labs ordered are listed, but only abnormal results are displayed)  Labs Reviewed  BASIC METABOLIC PANEL - Abnormal; Notable for the following components:      Result Value   Glucose, Bld 107 (*)    All  other components within normal limits  CBC - Abnormal; Notable for the following components:   WBC 11.7 (*)    All other components within normal limits  TROPONIN I  TROPONIN I   ____________________________________________  EKG  ED ECG REPORT I, Arelia Longest, the attending physician, personally viewed and interpreted this ECG.   Date: 12/05/2018  EKG Time: 1752  Rate: 98  Rhythm: normal sinus rhythm  Axis: Normal  Intervals:none  ST&T Change: No ST segment elevation or depression.  No abnormal T wave inversion.  ____________________________________________  RADIOLOGY  Chest x-ray without acute process. ____________________________________________   PROCEDURES  Procedure(s) performed:   Procedures  Critical Care performed:   ____________________________________________   INITIAL IMPRESSION / ASSESSMENT AND PLAN / ED COURSE  Pertinent labs &  imaging results that were available during my care of the patient were reviewed by me and considered in my medical decision making (see chart for details).  Differential diagnosis includes, but is not limited to, ACS, aortic dissection, pulmonary embolism, cardiac tamponade, pneumothorax, pneumonia, pericarditis, myocarditis, GI-related causes including esophagitis/gastritis, and musculoskeletal chest wall pain.   As part of my medical decision making, I reviewed the following data within the electronic MEDICAL RECORD NUMBER Notes from prior office visits to his primary care doctor  PERC negative.  Heart score of 2  ----------------------------------------- 8:35 PM on 12/05/2018 -----------------------------------------  Patient thus far with very reassuring work-up with a normal EKG as well as initially normal troponin.  Exam consistent with chest wall pain.  However, patient with several risk factors to make him at risk for cardiac chest pain.  Patient pending second troponin at this time.  Ordered a dose of Flexeril.   Patient to follow-up with Dr. Kirke Corin of cardiology.  Signed out to Dr. Scotty Court. ____________________________________________   FINAL CLINICAL IMPRESSION(S) / ED DIAGNOSES  Chest pain  NEW MEDICATIONS STARTED DURING THIS VISIT:  New Prescriptions   No medications on file     Note:  This document was prepared using Dragon voice recognition software and may include unintentional dictation errors.     Myrna Blazer, MD 12/05/18 2037

## 2018-12-05 NOTE — ED Provider Notes (Signed)
-----------------------------------------   10:34 PM on 12/05/2018 ----------------------------------------- Serial troponins are negative.  Feeling better.  Vital signs remain normal.  Stable for discharge home in good condition to follow-up with cardiology as planned by Dr. Pershing ProudSchaevitz.   Sharman CheekStafford, Labrenda Lasky, MD 12/05/18 2234

## 2018-12-05 NOTE — ED Triage Notes (Signed)
Pt reports that he developed left sided chest pain that radiates down his left arm, he denies any diaphoresis, N/V or SOB, he does report that he felt that his face was going numb. He states that this has been going on for years. He does have family history of cardiac problems.

## 2018-12-14 ENCOUNTER — Telehealth: Payer: Self-pay | Admitting: Nurse Practitioner

## 2018-12-14 NOTE — Telephone Encounter (Signed)
Brandy called  [phone] ext [phone]  Pt. Is having CT on  12/19/2018 before DOS pt need CT authorization

## 2018-12-15 ENCOUNTER — Telehealth: Payer: Self-pay | Admitting: Nurse Practitioner

## 2018-12-15 NOTE — Telephone Encounter (Signed)
Brandy with [facility] needs authorization for CT on Tuesday.  Her call back number is [phone]

## 2018-12-19 ENCOUNTER — Ambulatory Visit (HOSPITAL_COMMUNITY): Admission: RE | Admit: 2018-12-19 | Payer: BLUE CROSS/BLUE SHIELD | Source: Ambulatory Visit

## 2019-02-02 ENCOUNTER — Other Ambulatory Visit: Payer: Self-pay | Admitting: Nurse Practitioner

## 2019-02-02 DIAGNOSIS — R1012 Left upper quadrant pain: Secondary | ICD-10-CM

## 2019-02-09 ENCOUNTER — Ambulatory Visit: Payer: BLUE CROSS/BLUE SHIELD | Admitting: Nurse Practitioner

## 2019-02-23 ENCOUNTER — Ambulatory Visit: Payer: BLUE CROSS/BLUE SHIELD | Admitting: Nurse Practitioner

## 2019-02-23 ENCOUNTER — Encounter: Payer: Self-pay | Admitting: Nurse Practitioner

## 2019-02-23 ENCOUNTER — Other Ambulatory Visit: Payer: Self-pay

## 2019-02-23 VITALS — BP 121/73 | HR 83 | Temp 98.4°F | Ht 69.0 in | Wt 243.4 lb

## 2019-02-23 DIAGNOSIS — K219 Gastro-esophageal reflux disease without esophagitis: Secondary | ICD-10-CM | POA: Diagnosis not present

## 2019-02-23 DIAGNOSIS — R1012 Left upper quadrant pain: Secondary | ICD-10-CM | POA: Diagnosis not present

## 2019-02-23 DIAGNOSIS — I10 Essential (primary) hypertension: Secondary | ICD-10-CM | POA: Diagnosis not present

## 2019-02-23 DIAGNOSIS — A630 Anogenital (venereal) warts: Secondary | ICD-10-CM

## 2019-02-23 DIAGNOSIS — N529 Male erectile dysfunction, unspecified: Secondary | ICD-10-CM

## 2019-02-23 DIAGNOSIS — G4726 Circadian rhythm sleep disorder, shift work type: Secondary | ICD-10-CM

## 2019-02-23 MED ORDER — AMLODIPINE BESYLATE 10 MG PO TABS: 10.0000 mg | ORAL_TABLET | Freq: Every day | ORAL | 1 refills | Status: DC

## 2019-02-23 MED ORDER — OMEPRAZOLE 40 MG PO CPDR: 40.0000 mg | DELAYED_RELEASE_CAPSULE | Freq: Every day | ORAL | 1 refills | Status: DC

## 2019-02-23 MED ORDER — SUCRALFATE 1 G PO TABS: ORAL_TABLET | ORAL | 1 refills | Status: DC

## 2019-02-23 NOTE — Progress Notes (Signed)
Subjective:    Patient ID: Brandon Mccoy, male    DOB: June 16, 1976, 43 y.o.   MRN: [id number]  Brandon Mccoy is a 43 y.o. 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  Patient continues using CPAP.  Only certified for 3 months due to approx 85% usage of CPAP. - He is not regularly checking BP at home or outside of clinic.    - Current medications: amlodipine 10 mg once daily, tolerating well without side effects - He is not currently symptomatic. - Pt denies headache, lightheadedness, dizziness, changes in vision, chest tightness/pressure, palpitations, leg swelling, sudden loss of speech or loss of consciousness. - He  reports no regular exercise routine. - His diet is moderate in salt, moderate in fat, and moderate in carbohydrates.   GERD Continues to have occasional chest pains.  Patient notes sucralfate continues to help reduce symptoms.  Continues on PPI and is having no side effects from the medications.  ED Takes 1-2 tabs sildenafil when needed.   Question about low T today.  Patient requests lab testing for possible diagnosis to help with fatigue, ED, decreased libido.  Not currently dissatisfied in relationship due to symptoms.  Penile growth Patient has had penile growth similar to skin tag per patient report at scrotum at left side of penis and on dorsal penis.   Insomnia Patient has had ongoing difficulty with sleep.  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  Even when waking up does not feel rested.  Uses CPAP some and notes no significant difference on days he uses it more.  Social History   Tobacco Use  . Smoking status: Current Every Day Smoker    Packs/day: 0.50    Types: Cigarettes  . Smokeless tobacco: Never Used  Substance Use Topics  . Alcohol use: Yes    Comment: occasionally  . Drug use: No    Review of Systems Per HPI unless specifically indicated above       Objective:    BP 121/73 (BP Location: Left Arm, Patient Position: Sitting, Cuff Size: Normal)   Pulse 83   Temp 98.4 F (36.9 C) (Oral)   Ht  (1.753 m)   Wt 243 lb 6.4 oz (110.4 kg)   BMI 35.94 kg/m   Wt Readings from Last 3 Encounters:  02/23/19 243 lb 6.4 oz (110.4 kg)  12/05/18 242 lb (109.8 kg)  11/03/18 243 lb 12.8 oz (110.6 kg)    Physical Exam Vitals signs reviewed.  Constitutional:      General: He is not in acute distress.    Appearance: He is well-developed.  HENT:     Head: Normocephalic and atraumatic.  Cardiovascular:     Rate and Rhythm: Normal rate and regular rhythm.     Pulses:          Radial pulses are 2+ on the right side and 2+ on the left side.       Posterior tibial pulses are 1+ on the right side and 1+ on the left side.     Heart sounds: Normal heart sounds, S1 normal and S2 normal.  Pulmonary:     Effort: Pulmonary effort is normal. No respiratory distress.     Breath sounds: Normal breath sounds and air entry.  Abdominal:     General: Bowel sounds are normal. There is no distension.     Palpations: Abdomen is soft.  Tenderness: There is no abdominal tenderness.     Hernia: No hernia is present.  Genitourinary:      Comments: Genital and Rectal Exam chaperoned by Elvina Mattes, CMA Genital: penis normal shape without lesions or urethral discharge, scrotum intact without masses, spermatic cords palpated without edema or tenderness,epididymis normal without swelling or tenderness bilateral testicles descended equal in size and non-tender to palpation.  No inguinal hernia or lymphadenopathy. Skin changes as noted. Musculoskeletal:     Right lower leg: No edema.     Left lower leg: No edema.  Skin:    General: Skin is warm and dry.     Capillary Refill: Capillary refill takes less than 2 seconds.  Neurological:     Mental Status: He is alert and oriented to person, place, and time.  Psychiatric:        Attention and Perception:  Attention normal.        Mood and Affect: Mood and affect normal.        Behavior: Behavior normal. Behavior is cooperative.        Thought Content: Thought content normal.        Judgment: Judgment normal.     Results for orders placed or performed during the hospital encounter of 12/05/18  Basic metabolic panel  Result Value Ref Range   Sodium 139 135 - 145 mmol/L   Potassium 3.6 3.5 - 5.1 mmol/L   Chloride 105 98 - 111 mmol/L   CO2 25 22 - 32 mmol/L   Glucose, Bld 107 (H) 70 - 99 mg/dL   BUN 16 6 - 20 mg/dL   Creatinine, Ser 3.11 0.61 - 1.24 mg/dL   Calcium 9.1 8.9 - 21.6 mg/dL   GFR calc non Af Amer >60 >60 mL/min   GFR calc Af Amer >60 >60 mL/min   Anion gap 9 5 - 15  CBC  Result Value Ref Range   WBC 11.7 (H) 4.0 - 10.5 K/uL   RBC 5.21 4.22 - 5.81 MIL/uL   Hemoglobin 15.9 13.0 - 17.0 g/dL   HCT 24.4 69.5 - 07.2 %   MCV 92.1 80.0 - 100.0 fL   MCH 30.5 26.0 - 34.0 pg   MCHC 33.1 30.0 - 36.0 g/dL   RDW 25.7 50.5 - 18.3 %   Platelets 256 150 - 400 K/uL   nRBC 0.0 0.0 - 0.2 %  Troponin I - ONCE - STAT  Result Value Ref Range   Troponin I <0.03 <0.03 ng/mL  Troponin I - Once-Timed  Result Value Ref Range   Troponin I <0.03 <0.03 ng/mL      Assessment & Plan:   Problem List Items Addressed This Visit      Cardiovascular and Mediastinum   HTN (hypertension) - Primary Controlled hypertension.  BP goal < 130/80.  Pt is not working on lifestyle modifications.  Taking medications tolerating well without side effects. Complicated by obesity, sleep apnea.  Plan: 1. Continue taking amlodipine 10 mg once daily 2. Obtain labs in next 1 month  3. Encouraged heart healthy diet and increasing exercise to 30 minutes most days of the week. 4. Check BP 1-2 x per week at home, keep log, and bring to clinic at next appointment. 5. Follow up 6 months.   Relevant Medications   amLODipine (NORVASC) 10 MG tablet    Other Visit Diagnoses    Erectile dysfunction, unspecified  erectile dysfunction type     Stable. Responsive to sildenafil.  Continue prn.  Labs  ordered for low T.  Ordered for patient's usual sleep-wake cycle to be drawn in afternoon when patient normally wakes up. Follow-up with urology if testosterone low.   Relevant Orders   Testosterone, Free, Total, SHBG   LUQ pain     Stable today on exam.  Medications tolerated without side effects.  Continue at current doses.  Refills provided.  Check labs today. Followup prn.    Relevant Medications   omeprazole (PRILOSEC) 40 MG capsule   sucralfate (CARAFATE) 1 g tablet   Gastroesophageal reflux disease, esophagitis presence not specified     Currently well controlled on omeprazole 40 mg once daily.  Plan: 1. Continue omeprazole 40 mg once daily. Side effects discussed. Pt wants to continue med. - Also continues sucralfate 1g with meals as needed. 2. Avoid diet triggers. Reviewed need to seek care if globus sensation, difficulty swallowing, s/sx of GI bleed. 3. Follow up as needed and in 6 months.    Relevant Medications   omeprazole (PRILOSEC) 40 MG capsule   sucralfate (CARAFATE) 1 g tablet   Genital condyloma, male     Patient with penile condyloma, other genital warts on suprapubic area and skin tag left medial thigh. - Referral to urology for evaluation of condyloma.  Patient has concern for HPV. - Follow-up prn.   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  Likely shift-work sleep disorder remains.  - Encouraged ongoing work to improve sleep hygiene.  Handout provided and discussed. Focus on limiting daytime distractions/noises that cause wakefulness, screen use prior to sleep. - START melatonin to reset circadian rhythm.  Take 3-6 mg daily for 14 days.  Repeat as needed. 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      Meds ordered  this encounter  Medications  . amLODipine (NORVASC) 10 MG tablet    Sig: Take 1 tablet (10 mg total) by mouth daily.    Dispense:  90 tablet    Refill:  1    Order Specific Question:   Supervising Provider    Answer:   Smitty Cords [[id number]]  . omeprazole (PRILOSEC) 40 MG capsule    Sig: Take 1 capsule (40 mg total) by mouth daily.    Dispense:  90 capsule    Refill:  1    Order Specific Question:   Supervising Provider    Answer:   Smitty Cords [[id number]]  . sucralfate (CARAFATE) 1 g tablet    Sig: TAKE 1 TABLET 4 TIMES DAILYWITH MEALS AND AT BEDTIME    Dispense:  270 tablet    Refill:  1    Order Specific Question:   Supervising Provider    Answer:   Smitty Cords [[id number]]   Follow up plan: Return in about 6 months (around 08/26/2019) for hypertension, GERD AND for annual physical before December 2020.  Wilhelmina Mcardle, DNP, AGPCNP-BC Adult Gerontology Primary Care Nurse Practitioner [facility] 02/23/2019, 3:02 PM

## 2019-02-23 NOTE — Patient Instructions (Addendum)
Irving Shows,   Thank you for coming in to clinic today.  1. For sleep quality with shift sleeping: - May start melatonin 3 mg about 30 minutes before sleep for 10-14 days.  Repeat as needed to reset schedule.  Sleep Hygiene Tips  Take medicines only as directed by your health care provider.  Keep regular sleeping and waking hours. Avoid naps.  Keep a sleep diary to help you and your health care provider figure out what could be causing your insomnia. Include:  When you sleep.  When you wake up during the night.  How well you sleep.  How rested you feel the next day.  Any side effects of medicines you are taking.  What you eat and drink.  Make your bedroom a comfortable place where it is easy to fall asleep:  Put up shades or special blackout curtains to block light from outside.  Use a white noise machine to block noise.  Keep the temperature cool.  Exercise regularly as directed by your health care provider. Avoid exercising right before bedtime.  Use relaxation techniques to manage stress. Ask your health care provider to suggest some techniques that may work well for you. These may include:  Breathing exercises.  Routines to release muscle tension.  Visualizing peaceful scenes.  Cut back on alcohol, caffeinated beverages, and cigarettes, especially close to bedtime. These can disrupt your sleep.  Do not overeat or eat spicy foods right before bedtime. This can lead to digestive discomfort that can make it hard for you to sleep.  Limit screen use before bedtime. This includes:  Watching TV.  Using your smartphone, tablet, and computer.  Stick to a routine. This can help you fall asleep faster. Try to do a quiet activity, brush your teeth, and go to bed at the same time each night.  Get out of bed if you are still awake after 15 minutes of trying to sleep. Keep the lights down, but try reading or doing a quiet activity. When you feel sleepy, go back to  bed.  Make sure that you drive carefully. Avoid driving if you feel very sleepy.  Keep all follow-up appointments as directed by your health care provider. This is important.   2. Urology referral for possible genital wart/HPV.  3. Continue other medications without changes.  Please schedule a follow-up appointment with Wilhelmina Mcardle, AGNP. Return in about 6 months (around 08/26/2019) for hypertension, GERD AND for annual physical before December 2020.  If you have any other questions or concerns, please feel free to call the clinic or send a message through MyChart. You may also schedule an earlier appointment if necessary.  You will receive a survey after today's visit either digitally by e-mail or paper by [employer]. Your experiences and feedback matter to [place].  Please respond so we know how we are doing as we provide care for you.   Wilhelmina Mcardle, DNP, AGNP-BC Adult Gerontology Nurse Practitioner [facility], [facility]

## 2019-02-26 ENCOUNTER — Encounter: Payer: Self-pay | Admitting: Nurse Practitioner

## 2019-03-29 ENCOUNTER — Ambulatory Visit: Payer: BLUE CROSS/BLUE SHIELD | Admitting: Urology

## 2019-06-07 ENCOUNTER — Ambulatory Visit: Payer: BLUE CROSS/BLUE SHIELD | Admitting: Urology

## 2019-06-27 DIAGNOSIS — Z7689 Persons encountering health services in other specified circumstances: Secondary | ICD-10-CM | POA: Diagnosis not present

## 2019-06-27 DIAGNOSIS — R0789 Other chest pain: Secondary | ICD-10-CM | POA: Diagnosis not present

## 2019-07-11 DIAGNOSIS — E559 Vitamin D deficiency, unspecified: Secondary | ICD-10-CM | POA: Diagnosis not present

## 2019-07-11 DIAGNOSIS — R0789 Other chest pain: Secondary | ICD-10-CM | POA: Diagnosis not present

## 2019-07-11 DIAGNOSIS — R202 Paresthesia of skin: Secondary | ICD-10-CM | POA: Diagnosis not present

## 2019-07-11 DIAGNOSIS — F17200 Nicotine dependence, unspecified, uncomplicated: Secondary | ICD-10-CM | POA: Diagnosis not present

## 2019-07-12 DIAGNOSIS — R202 Paresthesia of skin: Secondary | ICD-10-CM | POA: Diagnosis not present

## 2019-07-19 ENCOUNTER — Ambulatory Visit: Payer: BC Managed Care – PPO | Admitting: Urology

## 2019-07-19 DIAGNOSIS — E538 Deficiency of other specified B group vitamins: Secondary | ICD-10-CM | POA: Diagnosis not present

## 2019-07-30 DIAGNOSIS — F17200 Nicotine dependence, unspecified, uncomplicated: Secondary | ICD-10-CM | POA: Diagnosis not present

## 2019-07-30 DIAGNOSIS — R202 Paresthesia of skin: Secondary | ICD-10-CM | POA: Diagnosis not present

## 2019-07-30 DIAGNOSIS — E559 Vitamin D deficiency, unspecified: Secondary | ICD-10-CM | POA: Diagnosis not present

## 2019-07-30 DIAGNOSIS — R0789 Other chest pain: Secondary | ICD-10-CM | POA: Diagnosis not present

## 2019-08-14 ENCOUNTER — Ambulatory Visit: Payer: BC Managed Care – PPO | Admitting: Urology

## 2019-08-21 ENCOUNTER — Ambulatory Visit: Payer: BC Managed Care – PPO | Admitting: Urology

## 2019-08-25 DIAGNOSIS — R112 Nausea with vomiting, unspecified: Secondary | ICD-10-CM | POA: Diagnosis not present

## 2019-08-25 DIAGNOSIS — R197 Diarrhea, unspecified: Secondary | ICD-10-CM | POA: Diagnosis not present

## 2019-09-11 ENCOUNTER — Encounter: Payer: Self-pay | Admitting: Urology

## 2019-09-11 ENCOUNTER — Ambulatory Visit: Payer: BC Managed Care – PPO | Admitting: Urology

## 2019-09-11 ENCOUNTER — Other Ambulatory Visit: Payer: Self-pay

## 2019-09-11 VITALS — BP 149/87 | HR 85 | Ht 69.0 in | Wt 244.0 lb

## 2019-09-11 DIAGNOSIS — A630 Anogenital (venereal) warts: Secondary | ICD-10-CM

## 2019-09-11 MED ORDER — IMIQUIMOD 5 % EX CREA: TOPICAL_CREAM | CUTANEOUS | 0 refills | Status: DC

## 2019-09-11 NOTE — Patient Instructions (Signed)
Genital Warts Genital warts are a common STD (sexually transmitted disease). They may appear as small bumps on the skin of the genital and anal areas. They sometimes become irritated and cause pain. Genital warts are easily passed to other people through sexual contact. Many people do not know that they are infected. They may be infected for years without symptoms. However, even if they do not have symptoms, they can pass the infection to their sexual partners. Getting treatment is important because genital warts can lead to other problems. In females, the virus that causes genital warts may increase the risk for cervical cancer. What are the causes? This condition is caused by a virus that is called human papillomavirus (HPV). HPV is spread by having unprotected sex with an infected person. It can be spread through vaginal, anal, and oral sex. What increases the risk? You are more likely to develop this condition if:  You have unprotected sex.  You have multiple sexual partners.  You are sexually active before age 16.  You are a man who isnot circumcised.  You have a male sexual partner who is not circumcised.  You have a weakened body defense system (immune system) due to disease or medicine.  You smoke. What are the signs or symptoms? Symptoms of this condition include:  Small growths in the genital area or anal area. These warts often grow in clusters.  Itching and irritation in the genital area or anal area.  Bleeding from the warts.  Pain during sex. How is this diagnosed? This condition is diagnosed based on:  Your symptoms.  A physical exam. You may also have other tests, including:  Biopsy. A tissue sample is removed so it can be checked under a microscope.  Colposcopy. In females, a magnifying tool is used to examine the vagina and cervix. Certain solutions may be used to make the HPV cells change color so they can be seen more easily.  A Pap test in females.   Tests for other STDs. How is this treated? This condition may be treated with:  Medicines, such as: ? Solutions or creams applied to your skin (topical).  Procedures, such as: ? Freezing the warts with liquid nitrogen (cryotherapy). ? Burning the warts with a laser or electric probe (electrocautery). ? Surgery to remove the warts. Follow these instructions at home: Medicines   Apply over-the-counter and prescription medicines only as told by your health care provider.  Do not treat genital warts with medicines that are used for treating hand warts.  Talk with your health care provider about using over-the-counter anti-itch creams. Instructions for women  Get screened regularly for cervical cancer. Women who have genital warts are at an increased risk for this cancer. This type of cancer is slow growing and can be cured if it is found early.  If you become pregnant, tell your health care provider that you have had an HPV infection. Your health care provider will monitor you closely during pregnancy to be sure that your baby is safe. General instructions  Do not touch or scratch the warts.  Do not have sex until your treatment has been completed.  Tell your current and past sexual partners about your condition because they may also need treatment.  After treatment, use condoms during sex to prevent future infections.  Keep all follow-up visits as told by your health care provider. This is important. How is this prevented? Talk with your health care provider about getting the HPV vaccine. The vaccine:    Can prevent some HPV infections and cancers.  Is recommended for males and females who are 11-26 years old.  Is not recommended for pregnant women.  Will not work if you already have HPV. Contact a health care provider if you:  Have redness, swelling, or pain in the area of the treated skin.  Have a fever.  Feel generally ill.  Feel lumps in and around your genital or  anal area.  Have bleeding in your genital or anal area.  Have pain during sex. Summary  Genital warts are a common STD (sexually transmitted disease). It may appear as small bumps on the genital and anal areas.  This condition is caused by a virus that is called human papillomavirus (HPV). HPV is spread by having unprotected sex with an infected person. It can be spread through vaginal, anal, and oral sex.  Treatment is important because genital warts can lead to other problems. In females, the virus that causes genital warts may increase the risk for cervical cancer.  This condition may be treated with medicine that is applied to the skin, or procedures to remove the warts.  The HPV vaccine can prevent some HPV infections and cancers. It is recommended that the vaccine be given to males and females who are 11-26 years old. This information is not intended to replace advice given to you by your health care provider. Make sure you discuss any questions you have with your health care provider. Document Released: 12/03/2000 Document Revised: 01/10/2018 Document Reviewed: 01/10/2018 Elsevier Patient Education  2020 Elsevier Inc.  

## 2019-09-11 NOTE — Progress Notes (Signed)
09/11/19 1:58 PM   Baxter Hire [birth year] [id number]  Referring provider: Mikey College, NP [employer] [place],  [place] [id number]  CC: Genital warts  HPI: I saw Mr. Spaid today in urology clinic in consultation for genital warts from Cassell Smiles, NP.  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  These are not painful.  He denies any history of STDs.  He is married.  There are no aggravating or alleviating factors.  Severity is mild.  He has not tried any medications or treatments for this previously.  He denies any difficulty with urination.   PMH: Past Medical History:  Diagnosis Date  . Allergy   . Hypertension     Surgical History: Past Surgical History:  Procedure Laterality Date  . ANKLE SURGERY    . COLONOSCOPY WITH PROPOFOL N/A 09/08/2017   Procedure: COLONOSCOPY WITH PROPOFOL;  Surgeon: Jonathon Bellows, MD;  Location: [facility] ENDOSCOPY;  Service: Gastroenterology;  Laterality: N/A;  . ESOPHAGOGASTRODUODENOSCOPY (EGD) WITH PROPOFOL N/A 09/08/2017   Procedure: ESOPHAGOGASTRODUODENOSCOPY (EGD) WITH PROPOFOL;  Surgeon: Jonathon Bellows, MD;  Location: [facility] ENDOSCOPY;  Service: Gastroenterology;  Laterality: N/A;  . HAND SURGERY    . HERNIA REPAIR      Allergies:  Allergies  Allergen Reactions  . Penicillins Other (See Comments)    Childhood allergy unknown    Family History: Family History  Problem Relation Age of Onset  . Heart disease Father        stent placement   . CVA Father   . Diabetes Father   . Heart attack Father 43  . Diabetes Mother     Social History:  reports that he has been smoking cigarettes. He has been smoking about 0.50 packs per day. He has never used smokeless tobacco. He reports current alcohol use. He reports that he does not use drugs.  ROS: Please see flowsheet from today's date for complete review of systems.  Physical Exam: BP (!) 149/87 (BP Location: Left Arm,  Patient Position: Sitting, Cuff Size: Large)   Pulse 85   Ht 5\' 9"  (1.753 m)   Wt 244 lb (110.7 kg)   BMI 36.03 kg/m    Constitutional:  Alert and oriented, No acute distress. Cardiovascular: No clubbing, cyanosis, or edema. Respiratory: Normal respiratory effort, no increased work of breathing. GI: Abdomen is soft, nontender, nondistended, no abdominal masses GU: Circumcised phallus with widely patent meatus.  Small genital wart approximately 4 mm in size at the right dorsal aspect of the distal penile shaft.  There is an additional 8 mm lesion at the base of the right scrotum. Lymph: No cervical or inguinal lymphadenopathy. Skin: No rashes, bruises or suspicious lesions. Neurologic: Grossly intact, no focal deficits, moving all 4 extremities. Psychiatric: Normal mood and affect.   Assessment & Plan:   In summary, the patient is a 43 year old male with new diagnosis of genital warts.  I had a long conversation with the patient about the etiology of these lesions, and the association with HPV.  We discussed routes of transmission.  We also discussed treatment options including topical cream, cryotherapy, or surgical excision.  He would like to pursue a course of topical imiquimod cream RTC 2 to 3 months for repeat exam and symptom check We discussed return precautions including significant enlargement or bleeding  Billey Co, MD  [place] [address] [place], [place]  [address] [phone]   

## 2019-10-18 DIAGNOSIS — R0789 Other chest pain: Secondary | ICD-10-CM | POA: Diagnosis not present

## 2019-10-18 DIAGNOSIS — G44219 Episodic tension-type headache, not intractable: Secondary | ICD-10-CM | POA: Diagnosis not present

## 2019-10-19 DIAGNOSIS — E538 Deficiency of other specified B group vitamins: Secondary | ICD-10-CM | POA: Diagnosis not present

## 2019-10-19 DIAGNOSIS — R079 Chest pain, unspecified: Secondary | ICD-10-CM | POA: Diagnosis not present

## 2019-10-19 DIAGNOSIS — E559 Vitamin D deficiency, unspecified: Secondary | ICD-10-CM | POA: Diagnosis not present

## 2019-10-23 ENCOUNTER — Other Ambulatory Visit: Payer: Self-pay | Admitting: Nurse Practitioner

## 2019-10-23 DIAGNOSIS — R1012 Left upper quadrant pain: Secondary | ICD-10-CM

## 2019-10-23 DIAGNOSIS — K219 Gastro-esophageal reflux disease without esophagitis: Secondary | ICD-10-CM

## 2019-10-24 DIAGNOSIS — E6609 Other obesity due to excess calories: Secondary | ICD-10-CM | POA: Diagnosis not present

## 2019-10-24 DIAGNOSIS — R079 Chest pain, unspecified: Secondary | ICD-10-CM | POA: Diagnosis not present

## 2019-10-24 DIAGNOSIS — I208 Other forms of angina pectoris: Secondary | ICD-10-CM | POA: Diagnosis not present

## 2019-10-24 DIAGNOSIS — R0602 Shortness of breath: Secondary | ICD-10-CM | POA: Diagnosis not present

## 2019-10-30 DIAGNOSIS — E538 Deficiency of other specified B group vitamins: Secondary | ICD-10-CM | POA: Diagnosis not present

## 2019-11-07 DIAGNOSIS — I208 Other forms of angina pectoris: Secondary | ICD-10-CM | POA: Diagnosis not present

## 2019-11-07 DIAGNOSIS — R0602 Shortness of breath: Secondary | ICD-10-CM | POA: Diagnosis not present

## 2019-11-13 DIAGNOSIS — E538 Deficiency of other specified B group vitamins: Secondary | ICD-10-CM | POA: Diagnosis not present

## 2019-11-18 ENCOUNTER — Encounter: Payer: Self-pay | Admitting: Emergency Medicine

## 2019-11-18 ENCOUNTER — Ambulatory Visit
Admission: EM | Admit: 2019-11-18 | Discharge: 2019-11-18 | Disposition: A | Discharge status: Home / Self Care | Payer: BC Managed Care – PPO | Attending: Emergency Medicine | Admitting: Emergency Medicine

## 2019-11-18 ENCOUNTER — Other Ambulatory Visit: Payer: Self-pay

## 2019-11-18 DIAGNOSIS — Z03818 Encounter for observation for suspected exposure to other biological agents ruled out: Secondary | ICD-10-CM

## 2019-11-18 DIAGNOSIS — R0981 Nasal congestion: Secondary | ICD-10-CM | POA: Diagnosis not present

## 2019-11-18 DIAGNOSIS — R519 Headache, unspecified: Secondary | ICD-10-CM | POA: Diagnosis not present

## 2019-11-18 DIAGNOSIS — J029 Acute pharyngitis, unspecified: Secondary | ICD-10-CM | POA: Diagnosis not present

## 2019-11-18 DIAGNOSIS — J069 Acute upper respiratory infection, unspecified: Secondary | ICD-10-CM

## 2019-11-18 NOTE — ED Triage Notes (Signed)
Pt c/o nasal congestion, headaches, sinus pain pressure, and cough. Started last night. Denies fever.

## 2019-11-18 NOTE — Discharge Instructions (Signed)
URI/COLD SYMPTOMS: Your exam today is consistent with a viral illness. Antibiotics are not indicated at this time. Use medications as directed, including cough syrup, nasal saline, and decongestants. Your symptoms should improve over the next few days and resolve within 7-10 days. Increase rest and fluids. F/u if symptoms worsen or predominate such as sore throat, ear pain, productive cough, shortness of breath, or if you develop high fevers or worsening fatigue over the next several days.  ° °Your condition could be due to COVID 19 infection. We have obtained testing to determine this and results will come back in 1-4 days. In the mean time, assume this is going to be a positive test and treat/monitor yourself as if you do have COVID.  ° °At this time go home and rest. Push fluids. Take Tylenol as needed for discomfort. Gargle warm salt water. Throat lozenges. Take Mucinex DM or Robitussin for cough. Humidifier in bedroom to ease coughing. Warm showers. Also review the COVID handout for more information.  ° °COVID-19 INFECTION: The incubation period of COVID-19 is approximately 14 days after exposure, with most symptoms developing in roughly 4-5 days. Symptoms may range in severity from mild to critically severe. Roughly 80% of those infected will have mild symptoms. People of any age may become infected with COVID-19 and have the ability to transmit the virus. The most common symptoms include: fever, fatigue, cough, body aches, headaches, sore throat, nasal congestion, shortness of breath, nausea, vomiting, diarrhea, changes in smell and/or taste.    ° °COURSE OF ILLNESS Some patients may begin with mild disease which can progress quickly into critical symptoms. If your symptoms are worsening please call ahead to the Emergency Department and proceed there for further treatment. Recovery time appears to be roughly 1-2 weeks for mild symptoms and 3-6 weeks for severe disease.   ° °GO IMMEDIATELY TO ER FOR FEVER  >100.6, BREATHING PROBLEMS, CHEST PAIN, FATIGUE, LETHARGY, INABILITY TO EAT OR DRINK, ETC  ° °QUARANTINE AND ISOLATION: To help decrease the spread of COVID-19 please remain isolated if you have COVID infection or are highly suspected to have COVID infection. This means -stay home and isolate to one room in the home if you live with others. Do not share a bed or bathroom with others while ill, sanitize and wipe down all countertops and keep common areas clean and disinfected. You may discontinue isolation if you have a mild case and are asymptomatic 10 days after symptom onset as long as you have been fever free >72 hours without having to take Motrin or Tylenol. If your case is more severe, you may have to isolate longer.   ° °If you have been in close contact (within 6 feet) of someone diagnosed with COVID 19, you are advised to quarantine in your home for 14 days as symptoms can develop anywhere from 2-14 days after exposure to the virus. If you develop symptoms, you  must isolate.   ° °During this global pandemic, CDC advises to practice social distancing, try to stay at least 6ft away from others at all times. Wear a face covering. Wash and sanitize your hands regularly and avoid going anywhere that is not necessary.  ° °KEEP IN MIND THAT THE COVID TEST IS NOT 100% ACCURATE AND YOU SHOULD STILL DO EVERYTHING TO PREVENT POTENTIAL SPREAD OF VIRUS TO OTHERS (WEAR MASK, WEAR GLOVES, WASH HANDS AND SANITIZE REGULARLY)  ° °  °

## 2019-11-18 NOTE — ED Provider Notes (Signed)
[facility]    CSN: [id number] Arrival date & time: 11/18/19  1500      History   Chief Complaint Chief Complaint  Patient presents with  . Cough  . Nasal Congestion    HPI Brandon Mccoy is a 43 y.o. male presenting with onset of cough, nasal congestion and sinus pressure yesterday. He has been taking OTC decongestants with some relief but says he wanted to get a COVID test to rule that out. Patient was not knowingly in direct contact with any person who was recently diagnosed with COVID. Patient denies any other symptoms. They deny fever, fatigue, chills/sweats, body aches,chest pain, breathing difficulty, abdominal pain, n/v/d, or changes in smell and taste. PMH significant for obesity, sleep apnea and hypertension. No other concerns today.  HPI  Past Medical History:  Diagnosis Date  . Allergy   . Hypertension     Patient Active Problem List   Diagnosis Date Noted  . Erectile dysfunction due to diseases classified elsewhere 10/11/2017  . Sleep apnea in adult 10/11/2017  . HLD (hyperlipidemia) 10/19/2016  . Severe obesity with body mass index (BMI) of 35.0 to 39.9 with comorbidity ([place]) 10/19/2016  . Elevated liver enzymes 09/10/2016  . HTN (hypertension) 04/27/2016  . Tobacco abuse 04/27/2016    Past Surgical History:  Procedure Laterality Date  . ANKLE SURGERY    . COLONOSCOPY WITH PROPOFOL N/A 09/08/2017   Procedure: COLONOSCOPY WITH PROPOFOL;  Surgeon: Jonathon Bellows, MD;  Location: [facility] ENDOSCOPY;  Service: Gastroenterology;  Laterality: N/A;  . ESOPHAGOGASTRODUODENOSCOPY (EGD) WITH PROPOFOL N/A 09/08/2017   Procedure: ESOPHAGOGASTRODUODENOSCOPY (EGD) WITH PROPOFOL;  Surgeon: Jonathon Bellows, MD;  Location: [facility] ENDOSCOPY;  Service: Gastroenterology;  Laterality: N/A;  . HAND SURGERY    . HERNIA REPAIR         Home Medications    Prior to Admission medications   Medication Sig Start Date End Date Taking? Authorizing Provider  amLODipine (NORVASC) 10  MG tablet Take 1 tablet (10 mg total) by mouth daily. 02/23/19  Yes Mikey College, NP  cyanocobalamin (,VITAMIN B-12,) 1000 MCG/ML injection Inject into the muscle. 07/12/19 07/06/20 Yes [provider]  omeprazole (PRILOSEC) 40 MG capsule TAKE 1 CAPSULE DAILY 10/23/19  Yes Karamalegos, Devonne Doughty, DO  Blood Pressure Monitoring (BLOOD PRESSURE CUFF) MISC 1 each by Does not apply route daily. 06/10/16   Luciana Axe, NP  busPIRone (BUSPAR) 7.5 MG tablet  06/27/19   [provider]  imiquimod (ALDARA) 5 % cream Apply topically 3 (three) times a week. 09/12/19   Billey Co, MD  sildenafil (REVATIO) 20 MG tablet Take 1-3 tablets once as needed to obtain an erection. Patient not taking: Reported on 09/11/2019 07/18/18   Mikey College, NP  sucralfate (CARAFATE) 1 g tablet TAKE 1 TABLET 4 TIMES DAILYWITH MEALS AND AT BEDTIME 02/23/19   Mikey College, NP    Family History Family History  Problem Relation Age of Onset  . Heart disease Father        stent placement   . CVA Father   . Diabetes Father   . Heart attack Father 35  . Diabetes Mother     Social History Social History   Tobacco Use  . Smoking status: Current Every Day Smoker    Packs/day: 0.50    Types: Cigarettes  . Smokeless tobacco: Never Used  Substance Use Topics  . Alcohol use: Yes    Comment: occasionally  . Drug use: No  Allergies   Penicillins   Review of Systems Review of Systems  Constitutional: Negative for fatigue and fever.  HENT: Positive for congestion, ear pain, postnasal drip, rhinorrhea, sinus pressure, sinus pain and sore throat.   Respiratory: Positive for cough. Negative for shortness of breath.   Cardiovascular: Negative for chest pain.  Gastrointestinal: Negative for abdominal pain, diarrhea, nausea and vomiting.  Musculoskeletal: Negative for arthralgias and myalgias.  Skin: Negative for rash.  Neurological: Positive for headaches. Negative for  weakness and light-headedness.     Physical Exam Triage Vital Signs ED Triage Vitals  Enc Vitals Group     BP 11/18/19 1526 134/83     Pulse Rate 11/18/19 1526 77     Resp 11/18/19 1526 18     Temp 11/18/19 1526 97.9 F (36.6 C)     Temp Source 11/18/19 1526 Oral     SpO2 11/18/19 1526 100 %     Weight 11/18/19 1522 239 lb (108.4 kg)     Height 11/18/19 1522  (1.753 m)     Head Circumference --      Peak Flow --      Pain Score 11/18/19 1522 2     Pain Loc --      Pain Edu? --      Excl. in GC? --    No data found.  Updated Vital Signs BP 134/83 (BP Location: Right Arm)   Pulse 77   Temp 97.9 F (36.6 C) (Oral)   Resp 18   Ht  (1.753 m)   Wt 239 lb (108.4 kg)   SpO2 100%   BMI 35.29 kg/m       Physical Exam Vitals signs and nursing note reviewed.  Constitutional:      General: He is not in acute distress.    Appearance: He is obese. He is not ill-appearing or toxic-appearing.  HENT:     Head: Normocephalic and atraumatic.     Right Ear: Ear canal and external ear normal.     Left Ear: Tympanic membrane, ear canal and external ear normal.     Ears:     Comments: Slight erythema to right TM, no bulging     Nose: Congestion and rhinorrhea (trace purulent drainage) present.     Comments: Mild TTP over left maxillary sinus    Mouth/Throat:     Mouth: Mucous membranes are moist.     Pharynx: Oropharynx is clear. No oropharyngeal exudate or posterior oropharyngeal erythema.  Eyes:     General: No scleral icterus.       Right eye: No discharge.        Left eye: No discharge.     Conjunctiva/sclera: Conjunctivae normal.  Neck:     Musculoskeletal: Neck supple.  Cardiovascular:     Rate and Rhythm: Normal rate and regular rhythm.     Heart sounds: Normal heart sounds. No murmur.  Pulmonary:     Effort: Pulmonary effort is normal. No respiratory distress.     Breath sounds: Normal breath sounds.  Lymphadenopathy:     Cervical: No cervical  adenopathy.  Neurological:     General: No focal deficit present.     Mental Status: He is alert. Mental status is at baseline.     Motor: No weakness.     Gait: Gait normal.  Psychiatric:        Mood and Affect: Mood normal.        Behavior: Behavior normal.  Thought Content: Thought content normal.      UC Treatments / Results  Labs (all labs ordered are listed, but only abnormal results are displayed) Labs Reviewed  NOVEL CORONAVIRUS, NAA (HOSP ORDER, SEND-OUT TO REF LAB; TAT 18-24 HRS)    EKG   Radiology No results found.  Procedures Procedures (including critical care time)  Medications Ordered in UC Medications - No data to display  Initial Impression / Assessment and Plan / UC Course  I have reviewed the triage vital signs and the nursing notes.  Pertinent labs & imaging results that were available during my care of the patient were reviewed by me and considered in my medical decision making (see chart for details).    Final Clinical Impressions(s) / UC Diagnoses   Final diagnoses:  Upper respiratory tract infection, unspecified type  Nasal congestion  Encntr for obs for susp expsr to oth biolg agents ruled out     Discharge Instructions     URI/COLD SYMPTOMS: Your exam today is consistent with a viral illness. Antibiotics are not indicated at this time. Use medications as directed, including cough syrup, nasal saline, and decongestants. Your symptoms should improve over the next few days and resolve within 7-10 days. Increase rest and fluids. F/u if symptoms worsen or predominate such as sore throat, ear pain, productive cough, shortness of breath, or if you develop high fevers or worsening fatigue over the next several days.   Your condition could be due to COVID 19 infection. We have obtained testing to determine this and results will come back in 1-4 days. In the mean time, assume this is going to be a positive test and treat/monitor yourself as if  you do have COVID.   At this time go home and rest. Push fluids. Take Tylenol as needed for discomfort. Gargle warm salt water. Throat lozenges. Take Mucinex DM or Robitussin for cough. Humidifier in bedroom to ease coughing. Warm showers. Also review the COVID handout for more information.   COVID-19 INFECTION: The incubation period of COVID-19 is approximately 14 days after exposure, with most symptoms developing in roughly 4-5 days. Symptoms may range in severity from mild to critically severe. Roughly 80% of those infected will have mild symptoms. People of any age may become infected with COVID-19 and have the ability to transmit the virus. The most common symptoms include: fever, fatigue, cough, body aches, headaches, sore throat, nasal congestion, shortness of breath, nausea, vomiting, diarrhea, changes in smell and/or taste.     COURSE OF ILLNESS Some patients may begin with mild disease which can progress quickly into critical symptoms. If your symptoms are worsening please call ahead to the Emergency Department and proceed there for further treatment. Recovery time appears to be roughly 1-2 weeks for mild symptoms and 3-6 weeks for severe disease.    GO IMMEDIATELY TO ER FOR FEVER >100.6, BREATHING PROBLEMS, CHEST PAIN, FATIGUE, LETHARGY, INABILITY TO EAT OR DRINK, ETC   QUARANTINE AND ISOLATION: To help decrease the spread of COVID-19 please remain isolated if you have COVID infection or are highly suspected to have COVID infection. This means -stay home and isolate to one room in the home if you live with others. Do not share a bed or bathroom with others while ill, sanitize and wipe down all countertops and keep common areas clean and disinfected. You may discontinue isolation if you have a mild case and are asymptomatic 10 days after symptom onset as long as you have been fever free >72  hours without having to take Motrin or Tylenol. If your case is more severe, you may have to isolate  longer.    If you have been in close contact (within 6 feet) of someone diagnosed with COVID 19, you are advised to quarantine in your home for 14 days as symptoms can develop anywhere from 2-14 days after exposure to the virus. If you develop symptoms, you  must isolate.    During this global pandemic, CDC advises to practice social distancing, try to stay at least 6ft away fro3m others at all times. Wear a face covering. Wash and sanitize your hands regularly and avoid going anywhere that is not necessary.   KEEP IN MIND THAT THE COVID TEST IS NOT 100% ACCURATE AND YOU SHOULD STILL DO EVERYTHING TO PREVENT POTENTIAL SPREAD OF VIRUS TO OTHERS (WEAR MASK, WEAR GLOVES, WASH HANDS AND SANITIZE REGULARLY)        ED Prescriptions    None     PDMP not reviewed this encounter.   Shirlee Latchaves, Tzvi Economou B, PA-C 11/18/19 1554

## 2019-11-20 DIAGNOSIS — I208 Other forms of angina pectoris: Secondary | ICD-10-CM | POA: Diagnosis not present

## 2019-11-20 DIAGNOSIS — R0602 Shortness of breath: Secondary | ICD-10-CM | POA: Diagnosis not present

## 2019-11-20 DIAGNOSIS — E6609 Other obesity due to excess calories: Secondary | ICD-10-CM | POA: Diagnosis not present

## 2019-11-20 DIAGNOSIS — R079 Chest pain, unspecified: Secondary | ICD-10-CM | POA: Diagnosis not present

## 2019-11-20 LAB — NOVEL CORONAVIRUS, NAA (HOSP ORDER, SEND-OUT TO REF LAB; TAT 18-24 HRS): SARS-CoV-2, NAA: NOT DETECTED

## 2019-11-23 DIAGNOSIS — J329 Chronic sinusitis, unspecified: Secondary | ICD-10-CM | POA: Diagnosis not present

## 2019-12-04 ENCOUNTER — Ambulatory Visit: Payer: BC Managed Care – PPO | Admitting: Urology

## 2019-12-11 DIAGNOSIS — E538 Deficiency of other specified B group vitamins: Secondary | ICD-10-CM | POA: Diagnosis not present

## 2019-12-17 ENCOUNTER — Other Ambulatory Visit: Payer: Self-pay | Admitting: Nurse Practitioner

## 2019-12-17 DIAGNOSIS — I10 Essential (primary) hypertension: Secondary | ICD-10-CM

## 2020-02-12 ENCOUNTER — Other Ambulatory Visit: Payer: Self-pay | Admitting: Neurology

## 2020-02-12 DIAGNOSIS — M79602 Pain in left arm: Secondary | ICD-10-CM

## 2020-02-12 DIAGNOSIS — M25512 Pain in left shoulder: Secondary | ICD-10-CM

## 2020-02-12 DIAGNOSIS — R0789 Other chest pain: Secondary | ICD-10-CM

## 2020-02-21 ENCOUNTER — Ambulatory Visit: Payer: BC Managed Care – PPO

## 2020-02-26 ENCOUNTER — Other Ambulatory Visit: Payer: Self-pay

## 2020-02-26 ENCOUNTER — Ambulatory Visit
Admission: RE | Admit: 2020-02-26 | Discharge: 2020-02-26 | Disposition: A | Discharge status: Home / Self Care | Payer: BC Managed Care – PPO | Source: Ambulatory Visit | Attending: Neurology | Admitting: Neurology

## 2020-02-26 DIAGNOSIS — R0789 Other chest pain: Secondary | ICD-10-CM

## 2020-02-26 DIAGNOSIS — M79602 Pain in left arm: Secondary | ICD-10-CM | POA: Insufficient documentation

## 2020-02-26 DIAGNOSIS — M25512 Pain in left shoulder: Secondary | ICD-10-CM | POA: Insufficient documentation

## 2020-04-30 ENCOUNTER — Other Ambulatory Visit: Payer: Self-pay | Admitting: Family Medicine

## 2020-04-30 DIAGNOSIS — K219 Gastro-esophageal reflux disease without esophagitis: Secondary | ICD-10-CM

## 2020-04-30 DIAGNOSIS — R1012 Left upper quadrant pain: Secondary | ICD-10-CM

## 2020-07-02 ENCOUNTER — Other Ambulatory Visit: Payer: Self-pay | Admitting: Family Medicine

## 2020-07-02 DIAGNOSIS — I10 Essential (primary) hypertension: Secondary | ICD-10-CM

## 2020-10-23 ENCOUNTER — Other Ambulatory Visit (HOSPITAL_COMMUNITY): Payer: Self-pay | Admitting: Neurology

## 2020-10-23 ENCOUNTER — Other Ambulatory Visit: Payer: Self-pay | Admitting: Neurology

## 2020-10-23 DIAGNOSIS — R519 Headache, unspecified: Secondary | ICD-10-CM

## 2020-11-05 ENCOUNTER — Ambulatory Visit
Admission: RE | Admit: 2020-11-05 | Discharge: 2020-11-05 | Disposition: A | Discharge status: Home / Self Care | Payer: BC Managed Care – PPO | Source: Ambulatory Visit | Attending: Neurology | Admitting: Neurology

## 2020-11-05 ENCOUNTER — Other Ambulatory Visit: Payer: Self-pay

## 2020-11-05 DIAGNOSIS — R519 Headache, unspecified: Secondary | ICD-10-CM | POA: Diagnosis present

## 2020-11-05 MED ORDER — GADOBUTROL 1 MMOL/ML IV SOLN
10.0000 mL | Freq: Once | INTRAVENOUS | Status: AC | PRN
  Administered 2020-11-05: 10 mL via INTRAVENOUS

## 2020-12-11 ENCOUNTER — Ambulatory Visit: Payer: BC Managed Care – PPO | Admitting: Student in an Organized Health Care Education/Training Program

## 2020-12-30 ENCOUNTER — Ambulatory Visit: Payer: BC Managed Care – PPO | Admitting: Student in an Organized Health Care Education/Training Program

## 2021-01-13 ENCOUNTER — Ambulatory Visit: Payer: BC Managed Care – PPO | Admitting: Student in an Organized Health Care Education/Training Program

## 2021-01-29 ENCOUNTER — Other Ambulatory Visit: Payer: Self-pay | Admitting: Nurse Practitioner

## 2021-01-29 DIAGNOSIS — N521 Erectile dysfunction due to diseases classified elsewhere: Secondary | ICD-10-CM

## 2021-03-03 ENCOUNTER — Ambulatory Visit: Payer: BC Managed Care – PPO | Admitting: Student in an Organized Health Care Education/Training Program

## 2021-03-04 ENCOUNTER — Ambulatory Visit (INDEPENDENT_AMBULATORY_CARE_PROVIDER_SITE_OTHER): Payer: BC Managed Care – PPO

## 2021-03-04 ENCOUNTER — Encounter: Payer: Self-pay | Admitting: Podiatry

## 2021-03-04 ENCOUNTER — Ambulatory Visit: Payer: BC Managed Care – PPO | Admitting: Podiatry

## 2021-03-04 ENCOUNTER — Other Ambulatory Visit: Payer: Self-pay | Admitting: Podiatry

## 2021-03-04 ENCOUNTER — Other Ambulatory Visit: Payer: Self-pay

## 2021-03-04 DIAGNOSIS — M10072 Idiopathic gout, left ankle and foot: Secondary | ICD-10-CM

## 2021-03-04 DIAGNOSIS — M10472 Other secondary gout, left ankle and foot: Secondary | ICD-10-CM

## 2021-03-04 DIAGNOSIS — M722 Plantar fascial fibromatosis: Secondary | ICD-10-CM

## 2021-03-04 MED ORDER — INDOMETHACIN 50 MG PO CAPS: 50.0000 mg | ORAL_CAPSULE | Freq: Three times a day (TID) | ORAL | 0 refills | Status: AC

## 2021-03-05 LAB — CBC WITH DIFFERENTIAL/PLATELET
Basophils Absolute: 0.1 10*3/uL (ref 0.0–0.2)
Basos: 1 %
EOS (ABSOLUTE): 0.2 10*3/uL (ref 0.0–0.4)
Eos: 1 %
Hematocrit: 48.6 % (ref 37.5–51.0)
Hemoglobin: 16 g/dL (ref 13.0–17.7)
Immature Grans (Abs): 0 10*3/uL (ref 0.0–0.1)
Immature Granulocytes: 0 %
Lymphocytes Absolute: 2.5 10*3/uL (ref 0.7–3.1)
Lymphs: 17 %
MCH: 29.9 pg (ref 26.6–33.0)
MCHC: 32.9 g/dL (ref 31.5–35.7)
MCV: 91 fL (ref 79–97)
Monocytes Absolute: 1.5 10*3/uL — ABNORMAL HIGH (ref 0.1–0.9)
Monocytes: 10 %
Neutrophils Absolute: 10.2 10*3/uL — ABNORMAL HIGH (ref 1.4–7.0)
Neutrophils: 71 %
Platelets: 265 10*3/uL (ref 150–450)
RBC: 5.36 x10E6/uL (ref 4.14–5.80)
RDW: 12.7 % (ref 11.6–15.4)
WBC: 14.5 10*3/uL — ABNORMAL HIGH (ref 3.4–10.8)

## 2021-03-05 LAB — BASIC METABOLIC PANEL
BUN/Creatinine Ratio: 11 (ref 9–20)
BUN: 12 mg/dL (ref 6–24)
CO2: 22 mmol/L (ref 20–29)
Calcium: 9.2 mg/dL (ref 8.7–10.2)
Chloride: 103 mmol/L (ref 96–106)
Creatinine, Ser: 1.1 mg/dL (ref 0.76–1.27)
Glucose: 144 mg/dL — ABNORMAL HIGH (ref 65–99)
Potassium: 4.5 mmol/L (ref 3.5–5.2)
Sodium: 142 mmol/L (ref 134–144)
eGFR: 84 mL/min/{1.73_m2} (ref >59)

## 2021-03-05 LAB — ARTHRITIS PANEL
Anti Nuclear Antibody (ANA): NEGATIVE
Rheumatoid fact SerPl-aCnc: <10 IU/mL (ref <14.0)
Sed Rate: 41 mm/hr — ABNORMAL HIGH (ref 0–15)
Uric Acid: 7.9 mg/dL (ref 3.8–8.4)

## 2021-03-08 NOTE — Progress Notes (Signed)
  Subjective:  Patient ID: Brandon Mccoy, male    DOB: 07-27-1976,  MRN: [id number]  Chief Complaint  Patient presents with  . Foot Pain    Patient presents today for right heel/arch pain x 2 weeks    45 y.o. male presents with the above complaint. History confirmed with patient.  Started after a cruise recently  Objective:  Physical Exam: warm, good capillary refill, no trophic changes or ulcerative lesions, normal DP and PT pulses and normal sensory exam.   Right Foot: Plantar mid arch pain does not worsen with tension of plantar fascia, no pain on heel.  Diffuse pain over forefoot and midfoot dorsally and with range of motion of digits, moderate edema and warmth   Radiographs: X-ray of the right foot: no fracture, dislocation, swelling or degenerative changes noted Assessment:   1. Plantar fasciitis of left foot   2. Other secondary acute gout of left foot      Plan:  Patient was evaluated and treated and all questions answered.  Discussed with him that I think this most likely is due to gout, probably exacerbated by his recent cruise.  Discussed etiology and treatment options for gout including acute and chronic gout.  Recommended treatment with indomethacin currently, and lab work for him was sent to be in to be done at [facility].  He does have some pain in the plantar fascia but I think this is likely deep arthritic pain he is developing from the gout and not from plantar fasciitis itself as he did not have classic symptoms but we will continue to monitor and treat as needed  Return in about 1 month (around 04/04/2021) for recheck gout left foot.

## 2021-03-09 ENCOUNTER — Encounter: Payer: Self-pay | Admitting: Podiatry

## 2021-03-25 ENCOUNTER — Other Ambulatory Visit: Payer: Self-pay | Admitting: Podiatry

## 2021-03-25 NOTE — Telephone Encounter (Signed)
Please advise 

## 2021-04-08 ENCOUNTER — Ambulatory Visit: Payer: BC Managed Care – PPO | Admitting: Podiatry

## 2021-09-08 ENCOUNTER — Ambulatory Visit: Payer: BC Managed Care – PPO | Admitting: Physician Assistant

## 2021-09-11 ENCOUNTER — Ambulatory Visit: Payer: BC Managed Care – PPO | Admitting: Urology

## 2021-09-30 ENCOUNTER — Encounter: Payer: Self-pay | Admitting: Urology

## 2021-09-30 ENCOUNTER — Ambulatory Visit: Payer: BC Managed Care – PPO | Admitting: Urology

## 2021-09-30 ENCOUNTER — Other Ambulatory Visit: Payer: Self-pay

## 2021-09-30 VITALS — BP 145/91 | HR 90 | Ht 69.0 in | Wt 245.0 lb

## 2021-09-30 DIAGNOSIS — A630 Anogenital (venereal) warts: Secondary | ICD-10-CM

## 2021-09-30 NOTE — Progress Notes (Signed)
   09/30/2021 2:02 PM   Brandon Mccoy Shows [birth year] [id number]  Reason for visit: Follow up genital warts  HPI: 45 year old male who I previously saw in September 2020 for 2 genital warts, 1 on the right scrotum, the other on the distal penile shaft.  He opted for a trial of imiquimod at that time which resolved those lesions.  He had recurrence of the similar lesions that are bothersome and he is interested in removal.  On exam he has an 8 mm lesion at the right scrotum, and a smaller 2 to 3 mm lesion on the shaft of the penis on the right side.  We discussed options again including imiquimod, Histofreeze/cryotherapy, or surgical excision.  He opts for cryotherapy.  Risk and benefits discussed extensively  Schedule histofreeze of 2 small genital warts   Sondra Come, MD  [facility] [address] [place], [place] [address] [phone]

## 2021-10-01 ENCOUNTER — Ambulatory Visit: Payer: BC Managed Care – PPO | Admitting: Urology

## 2021-10-01 VITALS — BP 139/81 | HR 108 | Ht 69.0 in | Wt 240.0 lb

## 2021-10-01 DIAGNOSIS — A630 Anogenital (venereal) warts: Secondary | ICD-10-CM

## 2021-10-01 NOTE — Progress Notes (Signed)
   10/01/2021 4:48 PM   Brandon Mccoy 1976-09-19 [id number]  Here today for cryoablation of multiple genital lesions.  He has an 8 mm lesion on the right scrotum, left groin crease, and a smaller 2 mm lesion on the right dorsal aspect of the mid shaft of the penis.  The cryotherapy device was applied to the lesions for 45 seconds each, patient tolerated procedure well  He would like to follow-up as needed, consider imiquimod cream or surgical excision in the future if recurrence   Sondra Come, MD  [facility] [address] [place], [place] [address] [phone]

## 2021-12-07 ENCOUNTER — Other Ambulatory Visit: Payer: Self-pay | Admitting: Internal Medicine

## 2021-12-07 DIAGNOSIS — R079 Chest pain, unspecified: Secondary | ICD-10-CM

## 2021-12-24 ENCOUNTER — Telehealth (HOSPITAL_COMMUNITY): Payer: Self-pay | Admitting: *Deleted

## 2021-12-24 NOTE — Telephone Encounter (Signed)
Attempted to call patient regarding upcoming cardiac CT appointment. °Left message on voicemail with name and callback number ° °Alixis Harmon RN Navigator Cardiac Imaging °[facility] °[phone] Office °[phone] Cell ° °

## 2021-12-25 ENCOUNTER — Other Ambulatory Visit (HOSPITAL_COMMUNITY): Payer: Self-pay | Admitting: *Deleted

## 2021-12-25 MED ORDER — METOPROLOL TARTRATE 100 MG PO TABS: ORAL_TABLET | ORAL | 0 refills | Status: AC

## 2021-12-28 ENCOUNTER — Ambulatory Visit: Admission: RE | Admit: 2021-12-28 | Payer: Self-pay | Source: Ambulatory Visit

## 2021-12-30 ENCOUNTER — Telehealth: Payer: Self-pay

## 2021-12-30 NOTE — Telephone Encounter (Signed)
Patient calling --   Patient states that he is suppose to have an Angiogram and that he doesnot want a CT scan done.   He states that his PCP would prefer him to have the Angiogram over the CT.  CT is scheduled for 01/16

## 2021-12-30 NOTE — Telephone Encounter (Signed)
This is not our patient. This patient belongs to Dr. Clayborn Bigness at [place].   [place]- Please call patient and advise him to reach out to their office.

## 2021-12-31 ENCOUNTER — Telehealth (HOSPITAL_COMMUNITY): Payer: Self-pay | Admitting: Emergency Medicine

## 2021-12-31 NOTE — Telephone Encounter (Signed)
Attempted to call patient regarding upcoming cardiac CT appointment. °Left message on voicemail with name and callback number °Lillyth Spong RN Navigator Cardiac Imaging ° [facility] °[phone] Office °[phone] Cell ° °

## 2022-01-04 ENCOUNTER — Other Ambulatory Visit: Payer: Self-pay

## 2022-01-04 ENCOUNTER — Ambulatory Visit
Admission: RE | Admit: 2022-01-04 | Discharge: 2022-01-04 | Disposition: A | Discharge status: Home / Self Care | Payer: PRIVATE HEALTH INSURANCE | Source: Ambulatory Visit | Attending: Internal Medicine | Admitting: Internal Medicine

## 2022-01-04 DIAGNOSIS — R079 Chest pain, unspecified: Secondary | ICD-10-CM

## 2022-01-04 LAB — POCT I-STAT CREATININE: Creatinine, Ser: 1 mg/dL (ref 0.61–1.24)

## 2022-01-04 MED ORDER — METOPROLOL TARTRATE 5 MG/5ML IV SOLN
10.0000 mg | Freq: Once | INTRAVENOUS | Status: AC
  Administered 2022-01-04: 10 mg via INTRAVENOUS

## 2022-01-04 MED ORDER — IOHEXOL 350 MG/ML SOLN
100.0000 mL | Freq: Once | INTRAVENOUS | Status: AC | PRN
  Administered 2022-01-04: 100 mL via INTRAVENOUS

## 2022-01-04 MED ORDER — NITROGLYCERIN 0.4 MG SL SUBL
0.8000 mg | SUBLINGUAL_TABLET | Freq: Once | SUBLINGUAL | Status: AC
  Administered 2022-01-04: 0.8 mg via SUBLINGUAL

## 2022-01-04 NOTE — Progress Notes (Signed)
Patient tolerated CT well. Drank a coke and ate peanut butter crackers after. Vital signs stable encourage to drink water throughout day.Reasons explained and verbalized understanding. Ambulated steady gait.

## 2022-06-20 IMAGING — CT CT HEART MORP W/ CTA COR W/ SCORE W/ CA W/CM &/OR W/O CM
1 of 15 series · 3 of 20 positions shown, 4 images · non-contrast
Comparison: None.

Addendum:
CLINICAL DATA: Chest pain

EXAM:
Cardiac/Coronary  CTA
TECHNIQUE: The patient was scanned on a Siemens Somatom go.Top scanner.

[Series 27: multiphase % cta coronary 0.60 · axial · 0.40mm/px · z∈[-1099,-1043]mm · 3 of 3372 slices shown, 4 images]
[im [id number]  vessel]
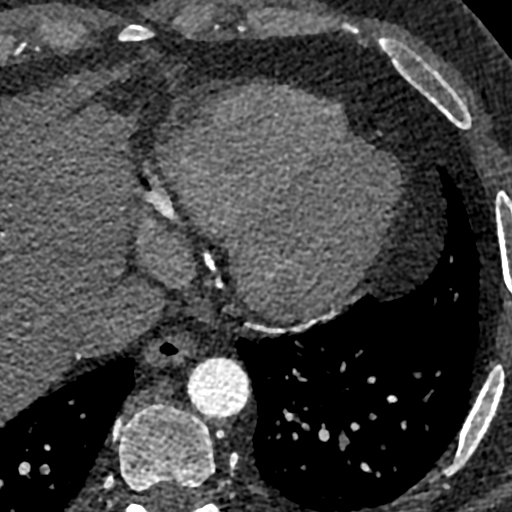
[im [id number]  lung]
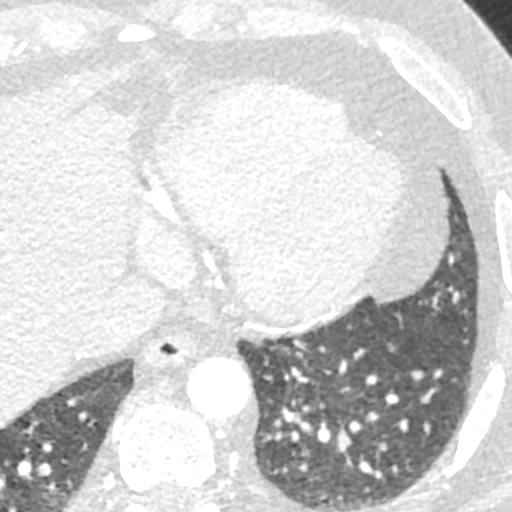
[im [id number]  vessel]
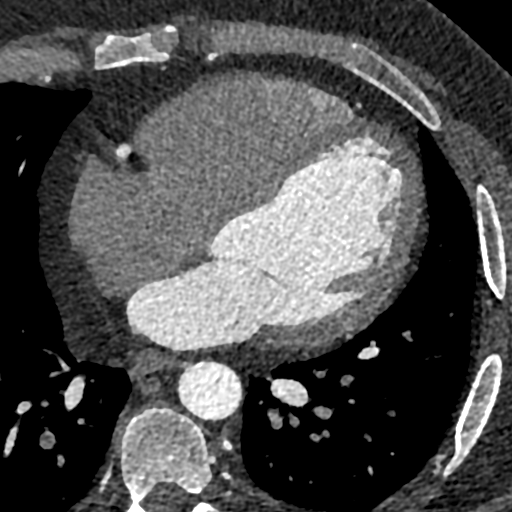
[im 2529/[id number]  vessel]
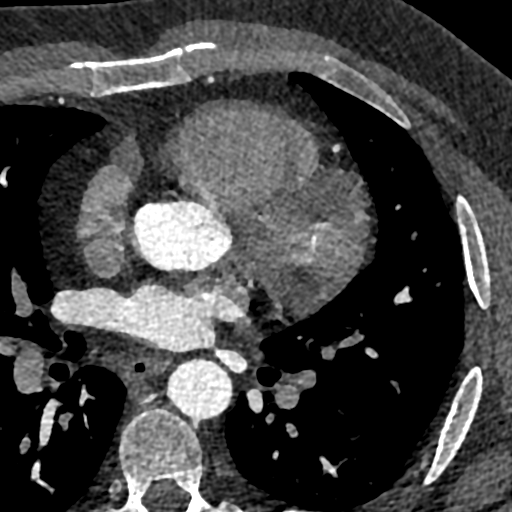

[3 of 20 positions shown; findings below may reference images not displayed]



Aortic Valve:  Trileaflet.  No calcifications.

Coronary Arteries:  Normal coronary origin.  Right dominance.

RCA is a dominant artery that gives rise to PDA and PLA. There is no
plaque.

Left main gives rise to LAD and LCX arteries. There is no LM disease

LAD  has no plaque.

LCX is a non-dominant artery that gives rise to one small OM1
branch. There is no plaque.

Other findings:

Normal pulmonary vein drainage into the left atrium.

Normal left atrial appendage without a thrombus.

Normal size of the pulmonary artery.
IMPRESSION: 1. Normal coronary calcium score of 0. Patient is low risk for
coronary events.

2. Normal coronary origin with right dominance.

3. No evidence of CAD.

4. CAD-RADS 0. Consider non-atherosclerotic causes of chest pain.

EXAM:
OVER-READ INTERPRETATION  CT CHEST

The following report is an over-read performed by radiologist Dr.
over-read does not include interpretation of cardiac or coronary
anatomy or pathology. The coronary calcium score interpretation by
the cardiologist is attached.
FINDINGS: No significant noncardiac vascular findings. Visualized mediastinum
and hilar regions demonstrate no lymphadenopathy or masses. There is
no evidence of pulmonary edema, consolidation, pneumothorax, nodule
or pleural fluid. Visualized upper abdomen and bony structures are
unremarkable.
IMPRESSION: No significant incidental findings.



Aortic Valve:  Trileaflet.  No calcifications.

Coronary Arteries:  Normal coronary origin.  Right dominance.

RCA is a dominant artery that gives rise to PDA and PLA. There is no
plaque.

Left main gives rise to LAD and LCX arteries. There is no LM disease

LAD  has no plaque.

LCX is a non-dominant artery that gives rise to one small OM1
branch. There is no plaque.

Other findings:

Normal pulmonary vein drainage into the left atrium.

Normal left atrial appendage without a thrombus.

Normal size of the pulmonary artery.
IMPRESSION: 1. Normal coronary calcium score of 0. Patient is low risk for
coronary events.

2. Normal coronary origin with right dominance.

3. No evidence of CAD.

4. CAD-RADS 0. Consider non-atherosclerotic causes of chest pain.

## 2022-09-14 ENCOUNTER — Encounter: Payer: Self-pay | Admitting: Family Medicine
